# Patient Record
Sex: Female | Born: 1995 | Race: Black or African American | Hispanic: No | Marital: Single | Smoking: Never smoker
Health system: Southern US, Community
[De-identification: ages and names within clinical notes are randomized; demographics above are authoritative.]

## PROBLEM LIST (undated history)

## (undated) ENCOUNTER — Inpatient Hospital Stay: Payer: Self-pay

## (undated) DIAGNOSIS — Z87440 Personal history of urinary (tract) infections: Secondary | ICD-10-CM

## (undated) DIAGNOSIS — D649 Anemia, unspecified: Secondary | ICD-10-CM

## (undated) DIAGNOSIS — Z8751 Personal history of pre-term labor: Secondary | ICD-10-CM

## (undated) HISTORY — DX: Personal history of urinary (tract) infections: Z87.440

## (undated) HISTORY — PX: NO PAST SURGERIES: SHX2092

## (undated) HISTORY — DX: Personal history of pre-term labor: Z87.51

## (undated) HISTORY — DX: Anemia, unspecified: D64.9

## (undated) HISTORY — PX: OTHER SURGICAL HISTORY: SHX169

---
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
## 2015-05-02 NOTE — H&P (Signed)
L&D Evaluation:  History:  HPI Pt is a 19 yo G1P0 pt of [facility] at 36.[redacted] weeks GA with and EDC of 02/05/14 who presents to L&D with reports of leaking fluid and contractions. Her prenatal course has been complicated by teen pregnancy, anemia, and + for chlamydia on 07/28/13 with a negstive TOC on 11/17/13. She is B+, RNI, VNI, GBS unknown. She did recieve her TDaP and Flu vaccine this pregnancy.   Presents with leaking fluid   Patient's Medical History No Chronic Illness   Patient's Surgical History none   Medications Pre Natal Vitamins  Iron   Allergies NKDA   Social History none   Family History Non-Contributory   ROS:  ROS All systems were reviewed.  HEENT, CNS, GI, GU, Respiratory, CV, Renal and Musculoskeletal systems were found to be normal.   Exam:  Vital Signs stable   General no apparent distress   Mental Status clear   Chest clear   Heart normal sinus rhythm   Abdomen gravid, tender with contractions   Pelvic by RN 7-8, ROM upon arrival   Mebranes Ruptured   Description green/meconium   FHT normal rate with no decels   Ucx irregular, every 1-3 minutes   Skin dry, no lesions, no rashes   Lymph no lymphadenopathy   Impression:  Impression active labor, PTL, PPROM, reactive NST, IUP at 36.2 weeks   Plan:  Plan EFM/NST, monitor contractions and for cervical change, antibiotics for unknown GBS status, GC/CT, IV pain meds as desired, anticipate SVD   Follow Up Appointment need to schedule   Electronic Signatures: Jannet MantisSubudhi, Emmeline Winebarger (CNM)  (Signed 19-Jan-15 09:03)  Authored: L&D Evaluation   Last Updated: 19-Jan-15 09:03 by Jannet MantisSubudhi, Travelle Mcclimans (CNM)

## 2017-02-18 LAB — HM PAP SMEAR: HM Pap smear: NEGATIVE

## 2019-08-20 ENCOUNTER — Other Ambulatory Visit: Payer: Self-pay

## 2019-08-20 DIAGNOSIS — Z20822 Contact with and (suspected) exposure to covid-19: Secondary | ICD-10-CM

## 2019-08-21 LAB — NOVEL CORONAVIRUS, NAA: SARS-CoV-2, NAA: NOT DETECTED

## 2019-11-30 ENCOUNTER — Ambulatory Visit (LOCAL_COMMUNITY_HEALTH_CENTER): Payer: Self-pay

## 2019-11-30 ENCOUNTER — Other Ambulatory Visit: Payer: Self-pay

## 2019-11-30 VITALS — BP 124/76 | Ht 62.5 in | Wt 104.5 lb

## 2019-11-30 DIAGNOSIS — Z3201 Encounter for pregnancy test, result positive: Secondary | ICD-10-CM

## 2019-11-30 LAB — PREGNANCY, URINE: Preg Test, Ur: POSITIVE — AB

## 2019-11-30 NOTE — Progress Notes (Signed)
Plans [facility] at [facility] (where employed). Vitamins not dispensed as currently taking vitamins she has purchased. Rich Number, RN

## 2020-04-07 ENCOUNTER — Encounter: Payer: Self-pay | Admitting: Emergency Medicine

## 2020-04-07 ENCOUNTER — Emergency Department
Admission: EM | Admit: 2020-04-07 | Discharge: 2020-04-07 | Disposition: A | Discharge status: Home / Self Care | Payer: BC Managed Care – PPO | Attending: Emergency Medicine | Admitting: Emergency Medicine

## 2020-04-07 ENCOUNTER — Other Ambulatory Visit: Payer: Self-pay

## 2020-04-07 DIAGNOSIS — Y999 Unspecified external cause status: Secondary | ICD-10-CM | POA: Insufficient documentation

## 2020-04-07 DIAGNOSIS — Y93E1 Activity, personal bathing and showering: Secondary | ICD-10-CM | POA: Diagnosis not present

## 2020-04-07 DIAGNOSIS — W182XXA Fall in (into) shower or empty bathtub, initial encounter: Secondary | ICD-10-CM | POA: Diagnosis not present

## 2020-04-07 DIAGNOSIS — S20222A Contusion of left back wall of thorax, initial encounter: Secondary | ICD-10-CM | POA: Insufficient documentation

## 2020-04-07 DIAGNOSIS — M542 Cervicalgia: Secondary | ICD-10-CM | POA: Diagnosis not present

## 2020-04-07 DIAGNOSIS — Y92002 Bathroom of unspecified non-institutional (private) residence single-family (private) house as the place of occurrence of the external cause: Secondary | ICD-10-CM | POA: Diagnosis not present

## 2020-04-07 DIAGNOSIS — O9A212 Injury, poisoning and certain other consequences of external causes complicating pregnancy, second trimester: Secondary | ICD-10-CM | POA: Diagnosis present

## 2020-04-07 DIAGNOSIS — Z79899 Other long term (current) drug therapy: Secondary | ICD-10-CM | POA: Diagnosis not present

## 2020-04-07 DIAGNOSIS — Z3A26 26 weeks gestation of pregnancy: Secondary | ICD-10-CM | POA: Insufficient documentation

## 2020-04-07 LAB — URINALYSIS, COMPLETE (UACMP) WITH MICROSCOPIC
Bilirubin Urine: NEGATIVE
Glucose, UA: NEGATIVE mg/dL
Hgb urine dipstick: NEGATIVE
Ketones, ur: NEGATIVE mg/dL
Nitrite: NEGATIVE
Protein, ur: NEGATIVE mg/dL
Specific Gravity, Urine: 1.014 (ref 1.005–1.030)
pH: 7 (ref 5.0–8.0)

## 2020-04-07 MED ORDER — LIDOCAINE 5 % EX PTCH: 1.0000 | MEDICATED_PATCH | Freq: Two times a day (BID) | CUTANEOUS | 0 refills | Status: AC

## 2020-04-07 MED ORDER — LIDOCAINE 5 % EX PTCH
1.0000 | MEDICATED_PATCH | CUTANEOUS | Status: DC
  Administered 2020-04-07: 13:00:00 1 via TRANSDERMAL
  Filled 2020-04-07: qty 1

## 2020-04-07 NOTE — ED Provider Notes (Signed)
[facility] Emergency Department Provider Note   ____________________________________________   First MD Initiated Contact with Patient 04/07/20 1235     (approximate)  I have reviewed the triage vital signs and the nursing notes.   HISTORY  Chief Complaint Back Pain    HPI Latoya Howell is a 24 y.o. female patient complain left flank pain secondary to a slip and fall in the shower noticed morning.  Patient rates her pain as a 6/10.  Patient described painhas Beltone mold.  In her verbal form daily but one of the joint line s "achy".  Patient also [redacted] weeks gestation and states she has not felt the baby move since the fall.  Patient denies abdominal pain or vaginal bleeding.       Past Medical History:  Diagnosis Date  . Anemia   . History of premature delivery    at 36 wks 2 d  . History of urinary tract infection     There are no problems to display for this patient.   Past Surgical History:  Procedure Laterality Date  . denies surgical hx      Prior to Admission medications   Medication Sig Start Date End Date Taking? Authorizing Provider  lidocaine (LIDODERM) 5 % Place 1 patch onto the skin every 12 (twelve) hours. Remove & Discard patch within 12 hours or as directed by MD 04/07/20 04/07/21  Sable Feil, PA-C  prenatal vitamin w/FE, FA (PRENATAL 1 + 1) 27-1 MG TABS tablet Take 1 tablet by mouth daily at 12 noon.    [provider]    Allergies Patient has no known allergies.  No family history on file.  Social History Social History   Tobacco Use  . Smoking status: Never Smoker  . Smokeless tobacco: Never Used  Substance Use Topics  . Alcohol use: Yes    Comment: Last use Thanksgiving  . Drug use: Yes    Types: Marijuana    Comment: Last use Thanksgiving 2020.    Review of Systems Constitutional: No fever/chills Eyes: No visual changes. ENT: No sore throat. Cardiovascular: Denies chest pain. Respiratory:  Denies shortness of breath. Gastrointestinal: No abdominal pain.  No nausea, no vomiting.  No diarrhea.  No constipation. Genitourinary: Negative for dysuria. Musculoskeletal: Left flank pain.   Skin: Negative for rash. Neurological: Negative for headaches, focal weakness or numbness.   ____________________________________________   PHYSICAL EXAM:  VITAL SIGNS: ED Triage Vitals  Enc Vitals Group     BP 04/07/20 1139 (!) 150/73     Pulse Rate 04/07/20 1139 94     Resp 04/07/20 1139 16     Temp 04/07/20 1139 98.5 F (36.9 C)     Temp Source 04/07/20 1139 Oral     SpO2 04/07/20 1139 100 %     Weight 04/07/20 1138 135 lb (61.2 kg)     Height 04/07/20 1138 5\' 1"  (1.549 m)     Head Circumference --      Peak Flow --      Pain Score 04/07/20 1138 6     Pain Loc --      Pain Edu? --      Excl. in [place]? --    Constitutional: Alert and oriented. Well appearing and in no acute distress. Neck: No cervical spine tenderness to palpation. Hematological/Lymphatic/Immunilogical: No cervical lymphadenopathy. Cardiovascular: Normal rate, regular rhythm. Grossly normal heart sounds.  Good peripheral circulation. Respiratory: Normal respiratory effort.  No retractions. Lungs CTAB. Gastrointestinal: Soft  and nontender. No distention. No abdominal bruits. No CVA tenderness. Genitourinary: Deferred Musculoskeletal: No lower extremity tenderness nor edema.  No joint effusions. Neurologic:  Normal speech and language. No gross focal neurologic deficits are appreciated. No gait instability. Skin:  Skin is warm, dry and intact. No rash noted.  No abrasion or ecchymosis. Psychiatric: Mood and affect are normal. Speech and behavior are normal.  ____________________________________________   LABS (all labs ordered are listed, but only abnormal results are displayed)  Labs Reviewed  URINALYSIS, COMPLETE (UACMP) WITH MICROSCOPIC - Abnormal; Notable for the following components:      Result Value    Color, Urine YELLOW (*)    APPearance CLOUDY (*)    Leukocytes,Ua TRACE (*)    Bacteria, UA RARE (*)    All other components within normal limits   ____________________________________________  EKG   ____________________________________________  RADIOLOGY  ED MD interpretation:    Official radiology report(s): No results found.  ____________________________________________   PROCEDURES  Procedure(s) performed (including Critical Care):  Procedures   ____________________________________________   INITIAL IMPRESSION / ASSESSMENT AND PLAN / ED COURSE  As part of my medical decision making, I reviewed the following data within the electronic MEDICAL RECORD NUMBER     Patient presents with left flank pain secondary to a slip and fall in the shower prior to arrival.  Physical exam was remarkable guarding with palpation the left CVA area.  Discussed negative urinalysis with patient.  Patient had fetal heart tones of 146.  Status post ultrasound placement patient felt fetal movement.  Patient given discharge care instruction and advised why imaging is contraindicated.  Advised extra strength Tylenol navigation Lidoderm patches.  Return to ED if condition worsens.    Latoya Howell was evaluated in Emergency Department on 04/07/2020 for the symptoms described in the history of present illness. She was evaluated in the context of the global COVID-19 pandemic, which necessitated consideration that the patient might be at risk for infection with the SARS-CoV-2 virus that causes COVID-19. Institutional protocols and algorithms that pertain to the evaluation of patients at risk for COVID-19 are in a state of rapid change based on information released by regulatory bodies including the CDC and federal and state organizations. These policies and algorithms were followed during the patient's care in the ED.       ____________________________________________   FINAL CLINICAL IMPRESSION(S) / ED  DIAGNOSES  Final diagnoses:  Contusion of left side of back, initial encounter     ED Discharge Orders         Ordered    lidocaine (LIDODERM) 5 %  Every 12 hours     04/07/20 1402           Note:  This document was prepared using Dragon voice recognition software and may include unintentional dictation errors.    Joni Reining, PA-C 04/07/20 1409    Jene Every, MD 04/07/20 1436

## 2020-04-07 NOTE — ED Notes (Signed)
See triage note  Presents s/p fall this am  States she fell while in the shower  Having lower back and neck soreness/pain  Pt is 26 weeks preg   But denies any vaginal bleeding

## 2020-04-07 NOTE — ED Triage Notes (Signed)
Pt to ED via POV, pt states that she fell about 2 hours PTA. Pt has been having back pain since then. Pt states that she wants to get checked out because she is [redacted] weeks pregnant. Pt denies abd cramping or vaginal bleeding. Pt is in NAD.

## 2020-04-07 NOTE — Discharge Instructions (Signed)
Follow discharge care instruction.  Use extra strength Tylenol and apply Lidoderm patch as needed for pain.

## 2021-05-27 ENCOUNTER — Emergency Department
Admission: EM | Admit: 2021-05-27 | Discharge: 2021-05-27 | Disposition: A | Discharge status: Home / Self Care | Payer: Medicaid Other

## 2021-06-20 ENCOUNTER — Ambulatory Visit
Admission: EM | Admit: 2021-06-20 | Discharge: 2021-06-20 | Disposition: A | Discharge status: Home / Self Care | Payer: Medicaid Other | Attending: Family Medicine | Admitting: Family Medicine

## 2021-06-20 ENCOUNTER — Other Ambulatory Visit: Payer: Self-pay

## 2021-06-20 ENCOUNTER — Encounter: Payer: Self-pay | Admitting: Emergency Medicine

## 2021-06-20 DIAGNOSIS — N3000 Acute cystitis without hematuria: Secondary | ICD-10-CM | POA: Insufficient documentation

## 2021-06-20 LAB — POCT URINALYSIS DIP (DEVICE)
Glucose, UA: 100 mg/dL — AB
Ketones, ur: 40 mg/dL — AB
Nitrite: POSITIVE — AB
Protein, ur: >=300 mg/dL — AB
Specific Gravity, Urine: >=1.03 (ref 1.005–1.030)
Urobilinogen, UA: 1 mg/dL (ref 0.0–1.0)
pH: 6 (ref 5.0–8.0)

## 2021-06-20 MED ORDER — CEPHALEXIN 500 MG PO CAPS: 500.0000 mg | ORAL_CAPSULE | Freq: Two times a day (BID) | ORAL | 0 refills | Status: DC

## 2021-06-20 NOTE — ED Provider Notes (Signed)
[facility]    CSN: [id number] Arrival date & time: 06/20/21  1755      History   Chief Complaint Chief Complaint  Patient presents with   Abdominal Pain   Dysuria    HPI  25 year old female presents with the above complaints.  Patient reports a 5-day history of symptoms.  Has been worsening.  Reports dysuria and urinary frequency.  Reports some lower abdominal pain.  She is currently breast-feeding.  She took some Azo and then subsequently stopped as she was told that this should not be taken with breast-feeding.  No fever.  She states that the Azo did help briefly.   Patient denies flank pain and back pain.  No nausea or vomiting.  Past Medical History:  Diagnosis Date   Anemia    History of premature delivery    at 36 wks 2 d   Past Surgical History:  Procedure Laterality Date   denies surgical hx      OB History     Gravida  2   Para  1   Term      Preterm      AB      Living  1      SAB      IAB      Ectopic      Multiple      Live Births  1            Home Medications    Prior to Admission medications   Medication Sig Start Date End Date Taking? Authorizing Provider  cephALEXin (KEFLEX) 500 MG capsule Take 1 capsule (500 mg total) by mouth 2 (two) times daily. 06/20/21  Yes Chrisie Jankovich G, DO  phenazopyridine (PYRIDIUM) 95 MG tablet Take 95 mg by mouth 3 (three) times daily as needed for pain.   Yes [provider]    Social History Social History   Tobacco Use   Smoking status: Never   Smokeless tobacco: Never  Vaping Use   Vaping Use: Never used  Substance Use Topics   Alcohol use: Yes    Comment: Last use Thanksgiving   Drug use: Yes    Types: Marijuana    Comment: Last use Thanksgiving 2020.     Allergies   Patient has no known allergies.   Review of Systems Review of Systems  Constitutional: Negative.   Gastrointestinal:  Positive for abdominal pain.  Genitourinary:  Positive for dysuria  and frequency.    Physical Exam Triage Vital Signs ED Triage Vitals  Enc Vitals Group     BP 06/20/21 1835 137/73     Pulse Rate 06/20/21 1835 81     Resp 06/20/21 1835 18     Temp 06/20/21 1835 98.6 F (37 C)     Temp Source 06/20/21 1835 Oral     SpO2 06/20/21 1835 98 %     Weight --      Height --      Head Circumference --      Peak Flow --      Pain Score 06/20/21 1831 10     Pain Loc --      Pain Edu? --      Excl. in [place]? --    Updated Vital Signs BP 137/73 (BP Location: Left Arm)   Pulse 81   Temp 98.6 F (37 C) (Oral)   Resp 18   LMP 05/28/2021   SpO2 98%   Breastfeeding Yes   Visual  Acuity Right Eye Distance:   Left Eye Distance:   Bilateral Distance:    Right Eye Near:   Left Eye Near:    Bilateral Near:     Physical Exam Vitals and nursing note reviewed.  Constitutional:      General: She is not in acute distress.    Appearance: She is well-developed. She is not ill-appearing.  HENT:     Head: Normocephalic and atraumatic.  Eyes:     General:        Right eye: No discharge.        Left eye: No discharge.     Conjunctiva/sclera: Conjunctivae normal.  Cardiovascular:     Rate and Rhythm: Normal rate and regular rhythm.     Heart sounds: No murmur heard. Pulmonary:     Effort: Pulmonary effort is normal.     Breath sounds: Normal breath sounds. No wheezing or rales.  Abdominal:     General: There is no distension.     Palpations: Abdomen is soft.     Comments: Suprapubic tenderness to palpation.  Neurological:     Mental Status: She is alert.     UC Treatments / Results  Labs (all labs ordered are listed, but only abnormal results are displayed) Labs Reviewed  POCT URINALYSIS DIP (DEVICE) - Abnormal; Notable for the following components:      Result Value   Glucose, UA 100 (*)    Bilirubin Urine SMALL (*)    Ketones, ur 40 (*)    Hgb urine dipstick LARGE (*)    Protein, ur >=300 (*)    Nitrite POSITIVE (*)    Leukocytes,Ua LARGE  (*)    All other components within normal limits  URINE CULTURE  POCT URINALYSIS DIPSTICK, ED / UC  POC URINE PREG, ED    EKG   Radiology No results found.  Procedures Procedures (including critical care time)  Medications Ordered in UC Medications - No data to display  Initial Impression / Assessment and Plan / UC Course  I have reviewed the triage vital signs and the nursing notes.  Pertinent labs & imaging results that were available during my care of the patient were reviewed by me and considered in my medical decision making (see chart for details).    25 year old female presents with UTI.  Sending culture.  Placing on Keflex.  Final Clinical Impressions(s) / UC Diagnoses   Final diagnoses:  Acute cystitis without hematuria   Discharge Instructions   None    ED Prescriptions     Medication Sig Dispense Auth. Provider   cephALEXin (KEFLEX) 500 MG capsule Take 1 capsule (500 mg total) by mouth 2 (two) times daily. 14 capsule Everlene Other G, DO      PDMP not reviewed this encounter.   Tommie Sams, [place] 06/20/21 1949

## 2021-06-20 NOTE — ED Triage Notes (Signed)
Pt presents today with c/o of lower abdominal pain with dysuria x 5 days. Denies vaginal discharge.

## 2021-06-23 LAB — URINE CULTURE: Culture: >=100000 — AB

## 2021-06-26 LAB — POCT PREGNANCY, URINE: Preg Test, Ur: NEGATIVE

## 2021-10-01 ENCOUNTER — Other Ambulatory Visit: Payer: Self-pay

## 2021-10-01 ENCOUNTER — Emergency Department: Payer: Medicaid Other

## 2021-10-01 ENCOUNTER — Emergency Department
Admission: EM | Admit: 2021-10-01 | Discharge: 2021-10-01 | Disposition: A | Discharge status: Home / Self Care | Payer: Medicaid Other | Attending: Emergency Medicine | Admitting: Emergency Medicine

## 2021-10-01 DIAGNOSIS — S161XXA Strain of muscle, fascia and tendon at neck level, initial encounter: Secondary | ICD-10-CM | POA: Diagnosis not present

## 2021-10-01 DIAGNOSIS — S199XXA Unspecified injury of neck, initial encounter: Secondary | ICD-10-CM | POA: Diagnosis present

## 2021-10-01 DIAGNOSIS — Y92410 Unspecified street and highway as the place of occurrence of the external cause: Secondary | ICD-10-CM | POA: Insufficient documentation

## 2021-10-01 LAB — PREGNANCY, URINE: Preg Test, Ur: NEGATIVE

## 2021-10-01 MED ORDER — IBUPROFEN 600 MG PO TABS
600.0000 mg | ORAL_TABLET | Freq: Once | ORAL | Status: AC
  Administered 2021-10-01: 600 mg via ORAL
  Filled 2021-10-01: qty 1

## 2021-10-01 MED ORDER — CYCLOBENZAPRINE HCL 10 MG PO TABS: 10.0000 mg | ORAL_TABLET | Freq: Three times a day (TID) | ORAL | 0 refills | Status: AC | PRN

## 2021-10-01 MED ORDER — CYCLOBENZAPRINE HCL 10 MG PO TABS
5.0000 mg | ORAL_TABLET | Freq: Once | ORAL | Status: AC
  Administered 2021-10-01: 5 mg via ORAL
  Filled 2021-10-01: qty 1
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
## 2021-10-01 NOTE — Discharge Instructions (Signed)
Please use an ice pack on the neck for the next 24 hours.  After that you may use heating pads.  Please use ibuprofen up to 600 mg every 8 hours and naproxen up to 500 mg every 12 hours as needed for continued pain.
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
## 2022-04-23 ENCOUNTER — Ambulatory Visit (LOCAL_COMMUNITY_HEALTH_CENTER): Payer: Medicaid Other | Admitting: Family Medicine

## 2022-04-23 ENCOUNTER — Encounter: Payer: Self-pay | Admitting: Family Medicine

## 2022-04-23 VITALS — BP 123/77 | Ht 62.0 in | Wt 109.2 lb

## 2022-04-23 DIAGNOSIS — Z30013 Encounter for initial prescription of injectable contraceptive: Secondary | ICD-10-CM | POA: Diagnosis not present

## 2022-04-23 DIAGNOSIS — Z3009 Encounter for other general counseling and advice on contraception: Secondary | ICD-10-CM

## 2022-04-23 DIAGNOSIS — Z0000 Encounter for general adult medical examination without abnormal findings: Secondary | ICD-10-CM | POA: Diagnosis not present

## 2022-04-23 DIAGNOSIS — Z3202 Encounter for pregnancy test, result negative: Secondary | ICD-10-CM | POA: Diagnosis not present

## 2022-04-23 DIAGNOSIS — Z309 Encounter for contraceptive management, unspecified: Secondary | ICD-10-CM

## 2022-04-23 DIAGNOSIS — Z3200 Encounter for pregnancy test, result unknown: Secondary | ICD-10-CM

## 2022-04-23 LAB — PREGNANCY, URINE: Preg Test, Ur: NEGATIVE

## 2022-04-23 MED ORDER — MEDROXYPROGESTERONE ACETATE 150 MG/ML IM SUSP
150.0000 mg | INTRAMUSCULAR | Status: AC
Start: 2022-04-23 — End: 2023-04-18
  Administered 2022-04-23: 150 mg via INTRAMUSCULAR

## 2022-04-23 NOTE — Progress Notes (Addendum)
Pt here for PE and birth control.  Pt notified of negative PT.  Depo 150 mg given IM in RUOQ without any complications.  Pt given reminder card to return in 11-13 weeks for next Depo injection.  Berdie Ogren, RN ? ?

## 2022-04-23 NOTE — Progress Notes (Signed)
[facility] DEPARTMENT ?[facility] ?[address] [employer] ?Main Number: [phone] ? ? ? ?Family Planning Visit- Initial Visit ? ?Subjective:  ?Latoya Howell is a 26 y.o.  [id number]   being seen today for an initial annual visit and to discuss reproductive life planning.  The patient is currently using No Method - No Contraceptive Precautions for pregnancy prevention. Patient reports   does not want a pregnancy in the next year.   ? ? report they are looking for a method that provides High efficacy at preventing pregnancy, Minimal bleeding/improved bleeding profile, Discrete method, Long term method, and Methods that does not involve too much memory ? ?Patient has the following medical conditions does not have a problem list on file. ? ?Chief Complaint  ?Patient presents with  ? Annual Exam  ?  Contraception  ? ? ?Patient reports here for physical and Depo, eventually desires nexplanon  ? ?Patient denies any other concerns.  ? ?Body mass index is 19.97 kg/m?. - Patient is eligible for diabetes screening based on BMI and age >40?  no ?HA1C ordered? not applicable ? ?Patient reports 1  partner/s in last year. Desires STI screening?  No - declined  ? ?Has patient been screened once for HCV in the past?  Yes ? No results found for: HCVAB ? ?Does the patient have current drug use (including MJ), have a partner with drug use, and/or has been incarcerated since last result? No  ?If yes-- Screen for HCV through [facility] ?  ?Does the patient meet criteria for HBV testing? Yes ? ?Criteria:  ?-Household, sexual or needle sharing contact with HBV ?-History of drug use ?-HIV positive ?-Those with known Hep C ? ? ?Health Maintenance Due  ?Topic Date Due  ? COVID-19 Vaccine (1) Never done  ? HPV VACCINES (1 - 2-dose series) Never done  ? Hepatitis C Screening  Never done  ? PAP SMEAR-Modifier  02/19/2020  ? ? ?Review of Systems  ?Constitutional:  Negative for chills, fever, malaise/fatigue and  weight loss.  ?HENT:  Negative for congestion, hearing loss and sore throat.   ?Eyes:  Negative for blurred vision, double vision and photophobia.  ?Respiratory:  Negative for shortness of breath.   ?Cardiovascular:  Negative for chest pain.  ?Gastrointestinal:  Negative for abdominal pain, blood in stool, constipation, diarrhea, heartburn, nausea and vomiting.  ?Genitourinary:  Negative for dysuria and frequency.  ?Musculoskeletal:  Negative for back pain, joint pain and neck pain.  ?Skin:  Negative for itching and rash.  ?Neurological:  Negative for dizziness, weakness and headaches.  ?Endo/Heme/Allergies:  Does not bruise/bleed easily.  ?Psychiatric/Behavioral:  Negative for depression, substance abuse and suicidal ideas.   ? ?The following portions of the patient's history were reviewed and updated as appropriate: allergies, current medications, past family history, past medical history, past social history, past surgical history and problem list. Problem list updated. ? ? ?See flowsheet for other program required questions. ? ?Objective:  ? ?Vitals:  ? 04/23/22 1347  ?BP: 123/77  ?Weight: 109 lb 3.2 oz (49.5 kg)  ?Height: 5\' 2"  (1.575 m)  ? ? ?Physical Exam ?Vitals and nursing note reviewed.  ?Constitutional:   ?   Appearance: Normal appearance.  ?HENT:  ?   Head: Normocephalic and atraumatic.  ?   Mouth/Throat:  ?   Mouth: Mucous membranes are moist.  ?   Dentition: Normal dentition. No dental caries.  ?   Pharynx: No oropharyngeal exudate or posterior oropharyngeal erythema.  ?  Eyes:  ?   General: No scleral icterus. ?Neck:  ?   Thyroid: No thyroid mass, thyromegaly or thyroid tenderness.  ?Cardiovascular:  ?   Rate and Rhythm: Normal rate and regular rhythm.  ?   Pulses: Normal pulses.  ?   Heart sounds: Normal heart sounds.  ?Pulmonary:  ?   Effort: Pulmonary effort is normal.  ?   Breath sounds: Normal breath sounds.  ?Chest:  ?Breasts: ?   Tanner Score is 5.  ?   Breasts are symmetrical.  ?   Right:  Normal.  ?   Left: Normal.  ?   Comments: Breasts:  ?      Right: Normal. No swelling, mass, nipple discharge, skin change or tenderness.  ?      Left: Normal. No swelling, mass, nipple discharge, skin change or tenderness.   ?Abdominal:  ?   General: Abdomen is flat. Bowel sounds are normal.  ?   Palpations: Abdomen is soft.  ?Musculoskeletal:     ?   General: Normal range of motion.  ?   Cervical back: Normal range of motion and neck supple.  ?Skin: ?   General: Skin is warm and dry.  ?Neurological:  ?   General: No focal deficit present.  ?   Mental Status: She is alert.  ? ? ? ? ?Assessment and Plan:  ?Latoya Howell is a 26 y.o. female presenting to the [facility] Department for an initial annual wellness/contraceptive visit ? ?Contraception counseling: Reviewed options based on patient desire and reproductive life plan. Patient is interested in Hormonal Implant and Hormonal Injection. This (injection) was provided to the patient today. Pt wants to discuss with job about changes before getting nexplanon d/t possible damage to device with current work.  ? ?Risks, benefits, and typical effectiveness rates were reviewed.  Questions were answered.  Written information was also given to the patient to review.   ? ?The patient will follow up in  1 years for surveillance.  The patient was told to call with any further questions, or with any concerns about this method of contraception.  Emphasized use of condoms 100% of the time for STI prevention. ? ?Need for ECP was assessed. Patient reported > 120 hours .  Reviewed options and patient desired No method of ECP, declined all   ? ?1. Encounter for pregnancy test, result unknown ?- Pregnancy, urine ? ?2. Routine general medical examination at a health care facility ?Well woman exam today ?Pap today  ?CBE today- self breast exam taught and teach back method used.   ? ?3. Encounter for initial prescription of injectable contraceptive ?Pt desires Depo and wants  to eventually change to nexplanon.  ?- medroxyPROGESTERone (DEPO-PROVERA) injection 150 mg ? ? ? ? ?No follow-ups on file. ? ?No future appointments. ? ?Wendi Snipes, FNP ?

## 2022-05-14 ENCOUNTER — Encounter: Payer: Self-pay | Admitting: Emergency Medicine

## 2022-05-14 ENCOUNTER — Other Ambulatory Visit: Payer: Self-pay

## 2022-05-14 ENCOUNTER — Ambulatory Visit
Admission: EM | Admit: 2022-05-14 | Discharge: 2022-05-14 | Disposition: A | Discharge status: Home / Self Care | Payer: Medicaid Other | Attending: Emergency Medicine | Admitting: Emergency Medicine

## 2022-05-14 DIAGNOSIS — R350 Frequency of micturition: Secondary | ICD-10-CM | POA: Insufficient documentation

## 2022-05-14 LAB — WET PREP, GENITAL
Clue Cells Wet Prep HPF POC: NONE SEEN
Sperm: NONE SEEN
Trich, Wet Prep: NONE SEEN
WBC, Wet Prep HPF POC: <10 — AB (ref <10)
Yeast Wet Prep HPF POC: NONE SEEN

## 2022-05-14 LAB — URINALYSIS, ROUTINE W REFLEX MICROSCOPIC
Bilirubin Urine: NEGATIVE
Glucose, UA: NEGATIVE mg/dL
Ketones, ur: NEGATIVE mg/dL
Nitrite: NEGATIVE
Protein, ur: >300 mg/dL — AB
Specific Gravity, Urine: 1.025 (ref 1.005–1.030)
pH: 7 (ref 5.0–8.0)

## 2022-05-14 LAB — URINALYSIS, MICROSCOPIC (REFLEX)

## 2022-05-14 MED ORDER — NITROFURANTOIN MONOHYD MACRO 100 MG PO CAPS: 100.0000 mg | ORAL_CAPSULE | Freq: Two times a day (BID) | ORAL | 0 refills | Status: DC

## 2022-05-14 NOTE — ED Notes (Signed)
Patient in bathroom

## 2022-05-14 NOTE — ED Triage Notes (Addendum)
4/20 started depo birth control.  Patient didn't start a period until 5/15  Low abdominal pain started 5/15.  Patient reports pain and increase in frequency of urination.    Patient reports taking 4 Cystex pills today and continues to have pain

## 2022-05-14 NOTE — Discharge Instructions (Signed)
Your urinalysis shows Latoya Howell blood cells but not nitrates which is an enzyme released from that bacteria therefore your urine will be sent to the lab to determine exactly which bacteria is present, if any changes need to be made to your medications you will be notified  Wet prep negative for yeast, BV and trichomonas uses  Since you are currently having symptoms that appear to be worsening we will start you some antibiotic, take Macrobid twice daily for 5 days   You may use over-the-counter Azo to help minimize your symptoms until antibiotic removes bacteria, this medication will turn your urine orange  Increase your fluid intake through use of water  As always practice good hygiene, wiping front to back and avoidance of scented vaginal products to prevent further irritation  If symptoms continue to persist after use of medication or recur please follow-up with urgent care or your primary doctor as needed

## 2022-05-14 NOTE — ED Provider Notes (Signed)
[facility]    CSN: [id number] Arrival date & time: 05/14/22  1019      History   Chief Complaint Chief Complaint  Patient presents with   Urinary Tract Infection    HPI Annalyse Langlais is a 26 y.o. female.   Patient presents with urinary frequency, increased urgency, dysuria and lower abdominal cramping for 7 days.  Symptoms have worsened over the last week.  Started Depo-Provera on 420 which is because an irregular menses, currently has been bleeding for 2 weeks, feels that the bleeding has thrown off her pH.  Has attempted use of Cystex which has been somewhat helpful.  Denies hematuria, flank pain, fever, chills, new rash or lesions, vaginal discharge, itching or odor.    Past Medical History:  Diagnosis Date   Anemia    History of premature delivery    at 36 wks 2 d    There are no problems to display for this patient.   Past Surgical History:  Procedure Laterality Date   denies surgical hx      OB History     Gravida  3   Para  2   Term      Preterm      AB  1   Living  2      SAB      IAB      Ectopic      Multiple      Live Births  2            Home Medications    Prior to Admission medications   Medication Sig Start Date End Date Taking? Authorizing Provider  Methenamine-Sodium Salicylate (CYSTEX PO) Take by mouth. Has been taking since friday   Yes [provider]  phenazopyridine (PYRIDIUM) 95 MG tablet Take 95 mg by mouth 3 (three) times daily as needed for pain. Patient not taking: Reported on 04/23/2022    [provider]    Family History Family History  Problem Relation Age of Onset   Diabetes Maternal Grandmother    Diabetes Maternal Grandfather    Diabetes Maternal Aunt     Social History Social History   Tobacco Use   Smoking status: Never   Smokeless tobacco: Never  Vaping Use   Vaping Use: Never used  Substance Use Topics   Alcohol use: Not Currently    Alcohol/week: 1.0  standard drink    Types: 1 Glasses of wine per week    Comment: Birthday in January 2023   Drug use: Not Currently    Types: Marijuana    Comment: Last use Thanksgiving 2020.     Allergies   Patient has no known allergies.   Review of Systems Review of Systems  Constitutional: Negative.   HENT: Negative.    Respiratory: Negative.    Genitourinary:  Positive for dysuria, frequency and pelvic pain. Negative for decreased urine volume, difficulty urinating, dyspareunia, enuresis, flank pain, genital sores, hematuria, menstrual problem, urgency, vaginal bleeding, vaginal discharge and vaginal pain.  Skin: Negative.     Physical Exam Triage Vital Signs ED Triage Vitals  Enc Vitals Group     BP 05/14/22 1032 139/83     Pulse Rate 05/14/22 1032 79     Resp 05/14/22 1032 18     Temp 05/14/22 1032 98.1 F (36.7 C)     Temp Source 05/14/22 1032 Oral     SpO2 05/14/22 1032 100 %     Weight --  Height --      Head Circumference --      Peak Flow --      Pain Score 05/14/22 1027 9     Pain Loc --      Pain Edu? --      Excl. in GC? --    No data found.  Updated Vital Signs BP 139/83 (BP Location: Left Arm)   Pulse 79   Temp 98.1 F (36.7 C) (Oral)   Resp 18   LMP 05/06/2022   SpO2 100%   Visual Acuity Right Eye Distance:   Left Eye Distance:   Bilateral Distance:    Right Eye Near:   Left Eye Near:    Bilateral Near:     Physical Exam Constitutional:      Appearance: Normal appearance.  HENT:     Head: Normocephalic.  Eyes:     Extraocular Movements: Extraocular movements intact.  Pulmonary:     Effort: Pulmonary effort is normal.  Abdominal:     General: Abdomen is flat. Bowel sounds are normal. There is no distension.     Palpations: Abdomen is soft.     Tenderness: There is no abdominal tenderness. There is no right CVA tenderness or left CVA tenderness.  Skin:    General: Skin is warm and dry.  Neurological:     Mental Status: She is alert and  oriented to person, place, and time. Mental status is at baseline.  Psychiatric:        Mood and Affect: Mood normal.        Behavior: Behavior normal.     UC Treatments / Results  Labs (all labs ordered are listed, but only abnormal results are displayed) Labs Reviewed  WET PREP, GENITAL  URINALYSIS, ROUTINE W REFLEX MICROSCOPIC    EKG   Radiology No results found.  Procedures Procedures (including critical care time)  Medications Ordered in UC Medications - No data to display  Initial Impression / Assessment and Plan / UC Course  I have reviewed the triage vital signs and the nursing notes.  Pertinent labs & imaging results that were available during my care of the patient were reviewed by me and considered in my medical decision making (see chart for details).  Urinary frequency  Wet prep negative, urinalysis showing [facility] blood cells, negative for nitrates, rare bacteria seen under the microscope, sent for culture, no tenderness is noted on the abdominal exam, no CVA tenderness noted but patient is visibly uncomfortable in exam when trying to sit still in efforts to not elicit pain, will begin antibiotic treatment prophylactically, Macrobid 5-day course prescribed, may continue use of Cystex as needed, recommend increase fluid intake and good hygiene, recommended follow-up with urgent care as needed if symptoms continue to persist or worsen, work note given Final Clinical Impressions(s) / UC Diagnoses   Final diagnoses:  None   Discharge Instructions   None    ED Prescriptions   None    PDMP not reviewed this encounter.   Valinda Hoar, NP 05/14/22 1133

## 2022-05-15 LAB — URINE CULTURE: Culture: NO GROWTH

## 2022-07-12 ENCOUNTER — Ambulatory Visit: Payer: Medicaid Other

## 2022-11-13 ENCOUNTER — Ambulatory Visit
Admission: EM | Admit: 2022-11-13 | Discharge: 2022-11-13 | Disposition: A | Discharge status: Home / Self Care | Payer: Medicaid Other | Attending: Emergency Medicine | Admitting: Emergency Medicine

## 2022-11-13 DIAGNOSIS — N946 Dysmenorrhea, unspecified: Secondary | ICD-10-CM | POA: Diagnosis not present

## 2022-11-13 DIAGNOSIS — A059 Bacterial foodborne intoxication, unspecified: Secondary | ICD-10-CM | POA: Insufficient documentation

## 2022-11-13 LAB — URINALYSIS, ROUTINE W REFLEX MICROSCOPIC
Bilirubin Urine: NEGATIVE
Glucose, UA: NEGATIVE mg/dL
Hgb urine dipstick: NEGATIVE
Ketones, ur: NEGATIVE mg/dL
Leukocytes,Ua: NEGATIVE
Nitrite: NEGATIVE
Protein, ur: NEGATIVE mg/dL
Specific Gravity, Urine: 1.01 (ref 1.005–1.030)
pH: 7 (ref 5.0–8.0)

## 2022-11-13 LAB — PREGNANCY, URINE: Preg Test, Ur: NEGATIVE

## 2022-11-13 MED ORDER — NAPROXEN 500 MG PO TABS: 500.0000 mg | ORAL_TABLET | Freq: Two times a day (BID) | ORAL | 0 refills | Status: AC

## 2022-11-13 MED ORDER — ONDANSETRON 4 MG PO TBDP: 4.0000 mg | ORAL_TABLET | Freq: Three times a day (TID) | ORAL | 0 refills | Status: DC | PRN

## 2022-11-13 NOTE — Discharge Instructions (Signed)
Take the Zofran as needed for nausea, vomiting and diarrhea.  Push electrolyte containing fluids such as Pedialyte, Gatorade.  Take the Naprosyn twice a day with 1000 mg of Tylenol.  This will help with the pain and slow down the bleeding.

## 2022-11-13 NOTE — ED Provider Notes (Signed)
HPI  SUBJECTIVE:  Latoya Howell is a 26 y.o. female who presents with 2 days of multiple episodes of nonbilious, nonbloody emesis, watery, nonbloody diarrhea after eating some questionable [place] tacos 2 nights ago.  She states that the symptoms are slowing down, and she is tolerating p.o. today.  She is requesting a doctor's note.  She also reports intermittent, nonmigratory, nonradiating low midline abdominal cramping pain starting yesterday consistent with menstrual cramps.  They last for 5 to 6 minutes, are coming at an irregular pattern, and she reports normal menstrual flow.  No vaginal odor, abnormal discharge.  She is in a long-term monogamous relationship with a female, who is asymptomatic.  STDs are not a concern today.  She has no urinary complaints.  She tried Aleve, soup, and increasing her water intake.  Aleve and soup help.  Symptoms are worse when she eats cold foods, sweets, or when she picks up heavy objects.  She has no past medical history.  LMP: Now.  Denies the possibility of being pregnant.  [facility]    Past Medical History:  Diagnosis Date   Anemia    History of premature delivery    at 36 wks 2 d    Past Surgical History:  Procedure Laterality Date   denies surgical hx      Family History  Problem Relation Age of Onset   Diabetes Maternal Grandmother    Diabetes Maternal Grandfather    Diabetes Maternal Aunt     Social History   Tobacco Use   Smoking status: Never   Smokeless tobacco: Never  Vaping Use   Vaping Use: Never used  Substance Use Topics   Alcohol use: Not Currently    Alcohol/week: 1.0 standard drink of alcohol    Types: 1 Glasses of wine per week    Comment: Birthday in January 2023   Drug use: Not Currently    Types: Marijuana    Comment: Last use Thanksgiving 2020.     Current Facility-Administered Medications:    medroxyPROGESTERone (DEPO-PROVERA) injection 150 mg, 150 mg, Intramuscular, Q90 days, Wendi Snipes, FNP, 150  mg at 04/23/22 1500  Current Outpatient Medications:    naproxen (NAPROSYN) 500 MG tablet, Take 1 tablet (500 mg total) by mouth 2 (two) times daily for 5 days., Disp: 10 tablet, Rfl: 0   ondansetron (ZOFRAN-ODT) 4 MG disintegrating tablet, Take 1 tablet (4 mg total) by mouth every 8 (eight) hours as needed for nausea or vomiting., Disp: 20 tablet, Rfl: 0   Methenamine-Sodium Salicylate (CYSTEX PO), Take by mouth. Has been taking since friday, Disp: , Rfl:   No Known Allergies   ROS  As noted in HPI.   Physical Exam  BP 125/78 (BP Location: Right Arm)   Pulse 78   Temp 98.2 F (36.8 C) (Oral)   Resp 16   SpO2 100%   Constitutional: Well developed, well nourished, no acute distress Eyes:  EOMI, conjunctiva normal bilaterally HENT: Normocephalic, atraumatic,mucus membranes moist Respiratory: Normal inspiratory effort, lungs clear bilaterally. Cardiovascular: Normal rate, regular rhythm, no murmurs, rubs, gallops.  Cap refill less than 2 seconds. GI: Flat, nondistended soft, mild suprapubic tenderness no guarding, rebound.  Active bowel sounds.  Moving around the table comfortably. Back: No CVAT skin: No rash, skin intact Musculoskeletal: no deformities Neurologic: Alert & oriented x 3, no focal neuro deficits Psychiatric: Speech and behavior appropriate   ED Course   Medications - No data to display  Orders Placed This Encounter  Procedures  Urinalysis, Routine w reflex microscopic    Standing Status:   Standing    Number of Occurrences:   1   Pregnancy, urine    Standing Status:   Standing    Number of Occurrences:   1    Results for orders placed or performed during the hospital encounter of 11/13/22 (from the past 24 hour(s))  Urinalysis, Routine w reflex microscopic     Status: None   Collection Time: 11/13/22 10:47 AM  Result Value Ref Range   Color, Urine YELLOW YELLOW   APPearance CLEAR CLEAR   Specific Gravity, Urine 1.010 1.005 - 1.030   pH 7.0 5.0 -  8.0   Glucose, UA NEGATIVE NEGATIVE mg/dL   Hgb urine dipstick NEGATIVE NEGATIVE   Bilirubin Urine NEGATIVE NEGATIVE   Ketones, ur NEGATIVE NEGATIVE mg/dL   Protein, ur NEGATIVE NEGATIVE mg/dL   Nitrite NEGATIVE NEGATIVE   Leukocytes,Ua NEGATIVE NEGATIVE  Pregnancy, urine     Status: None   Collection Time: 11/13/22 10:48 AM  Result Value Ref Range   Preg Test, Ur NEGATIVE NEGATIVE   No results found.  ED Clinical Impression  1. Food poisoning   2. Dysmenorrhea      ED Assessment/Plan      UA, urine pregnancy negative.  1.  Nausea/vomiting/diarrhea-presentation consistent with gastroenteritis/food poisoning.  Patient is tolerating p.o. today.  She appears well-hydrated.  Will send home with Zofran.  Patient to continue pushing fluids.  2.  Lower abdominal pain-patient states this is consistent with menses.  She is reporting normal flow.  No signs or symptoms of anemia.  home with Naprosyn/Tylenol, work note to return on Saturday.  Discussed labs,MDM, treatment plan, and plan for follow-up with patient. . patient agrees with plan.   Meds ordered this encounter  Medications   naproxen (NAPROSYN) 500 MG tablet    Sig: Take 1 tablet (500 mg total) by mouth 2 (two) times daily for 5 days.    Dispense:  10 tablet    Refill:  0   ondansetron (ZOFRAN-ODT) 4 MG disintegrating tablet    Sig: Take 1 tablet (4 mg total) by mouth every 8 (eight) hours as needed for nausea or vomiting.    Dispense:  20 tablet    Refill:  0      *This clinic note was created using Scientist, clinical (histocompatibility and immunogenetics). Therefore, there may be occasional mistakes despite careful proofreading.  ?    Domenick Gong, MD 11/16/22 1623

## 2022-11-13 NOTE — ED Triage Notes (Signed)
Pt present abdomen pain with diarrhea and cramping. Symptom started yesterday.  Pt states her period started yesterday and very heavy.

## 2022-12-05 IMAGING — CT CT CERVICAL SPINE W/O CM
3 of 4 series · 13 of 33 positions shown, 16 images · non-contrast
Comparison: None.

CLINICAL DATA: Neck trauma, dangerous injury mechanism (Age 16-64y)

EXAM:
CT CERVICAL SPINE WITHOUT CONTRAST
TECHNIQUE: Multidetector CT imaging of the cervical spine was performed without
intravenous contrast. Multiplanar CT image reconstructions were also
generated.

[Series 4: sagittal bone · sagittal · 0.28mm/px · 5 of 87 slices shown, 6 images]
[im 29/87  bone]
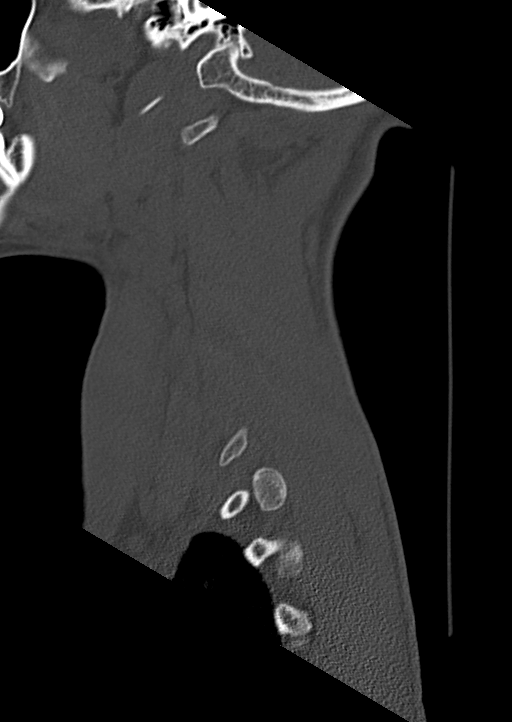
[im 36/87  bone]
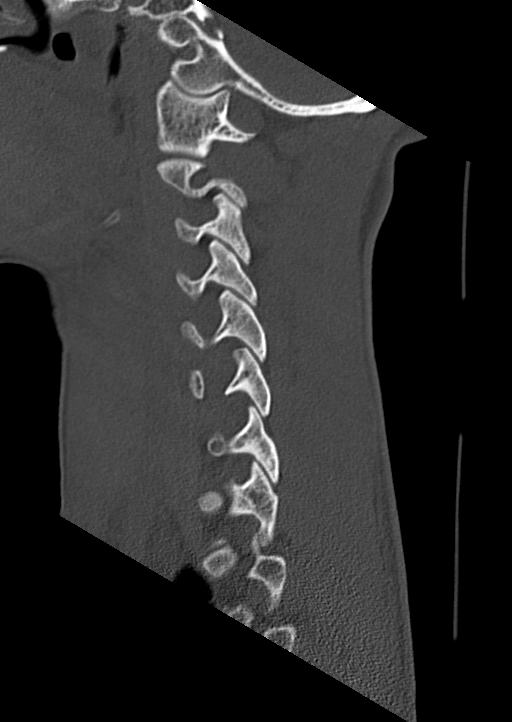
[im 44/87  soft-tissue]
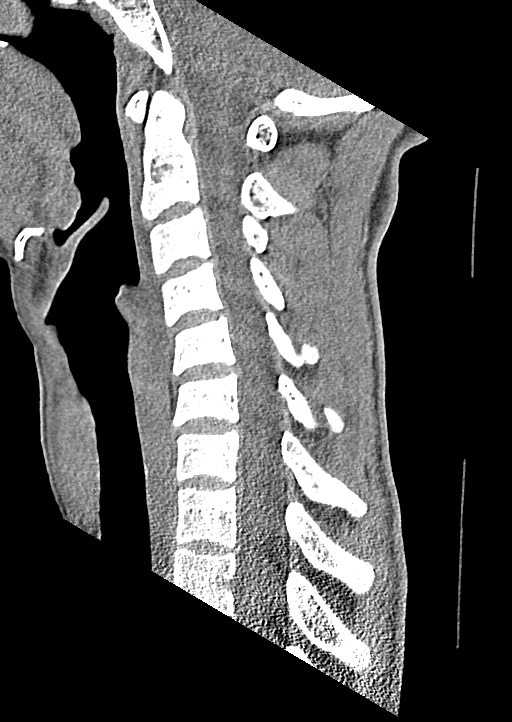
[im 44/87  bone]
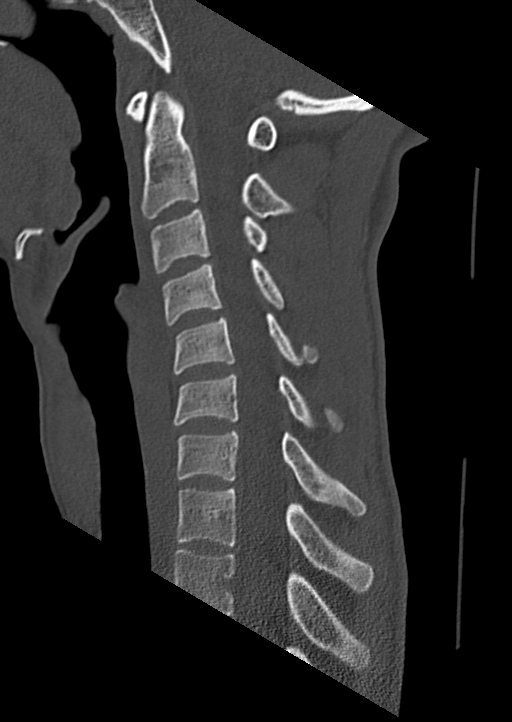
[im 51/87  bone]
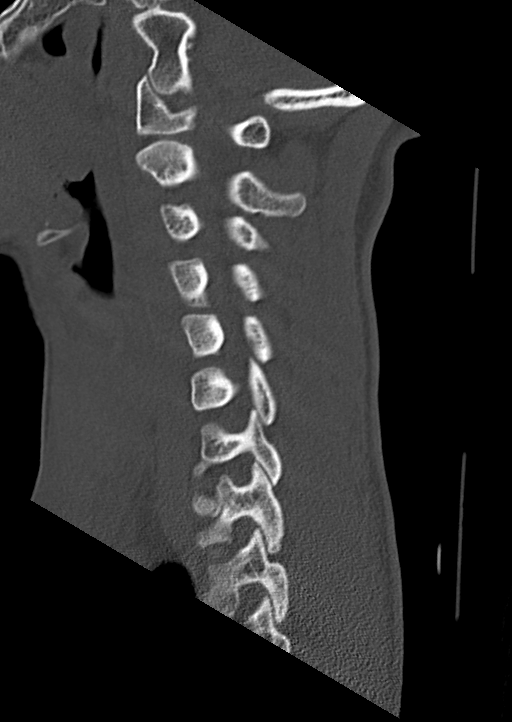
[im 58/87  bone]
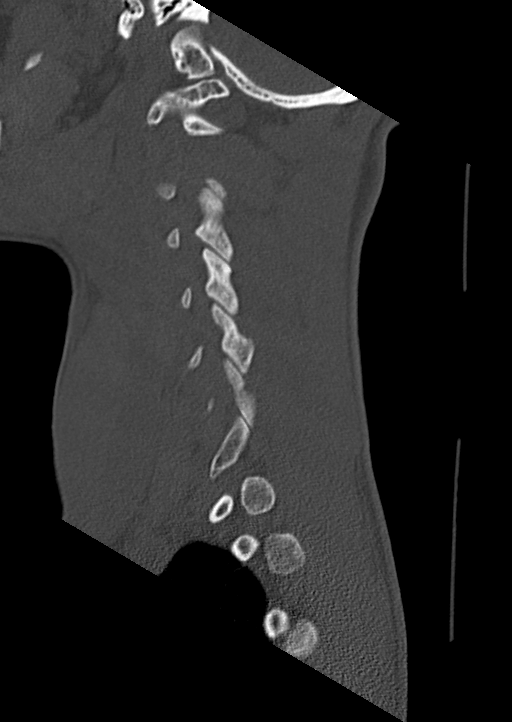

[Series 5: coronal bone · coronal · 0.34mm/px · 3 of 73 slices shown]
[im 22/73  bone]
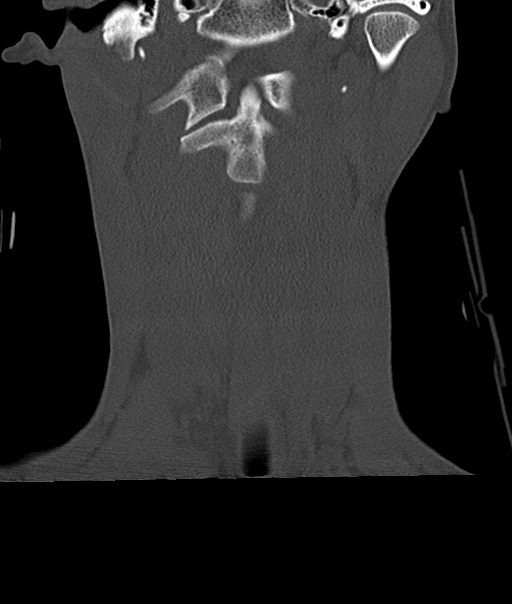
[im 32/73  bone]
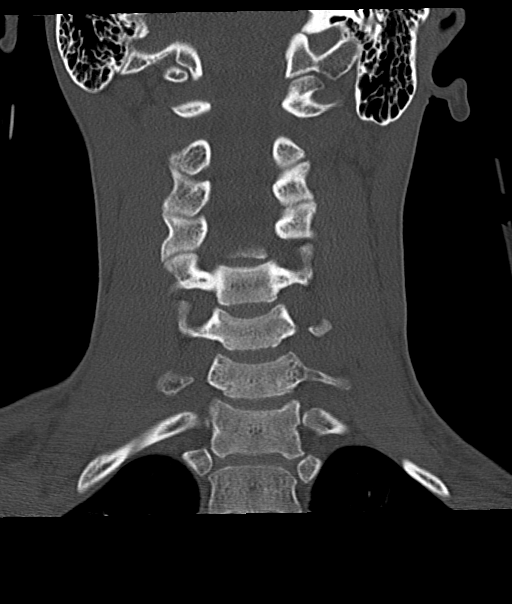
[im 41/73  bone]
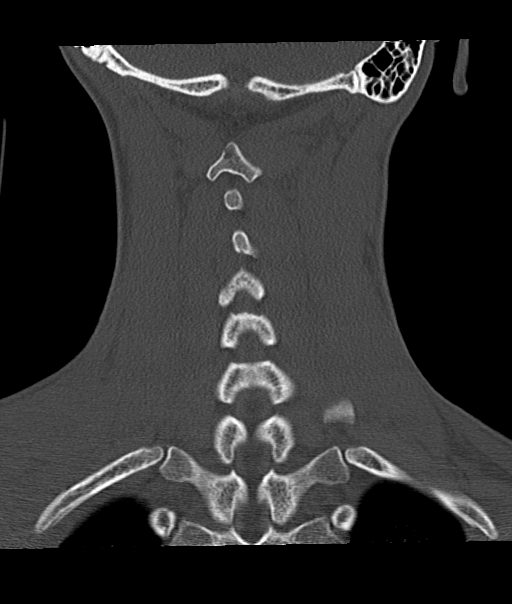

[Series 6: orthogonal bone · axial · 0.28mm/px · z∈[+107,+230]mm · 5 of 102 slices shown, 7 images]
[im 15/102  soft-tissue]
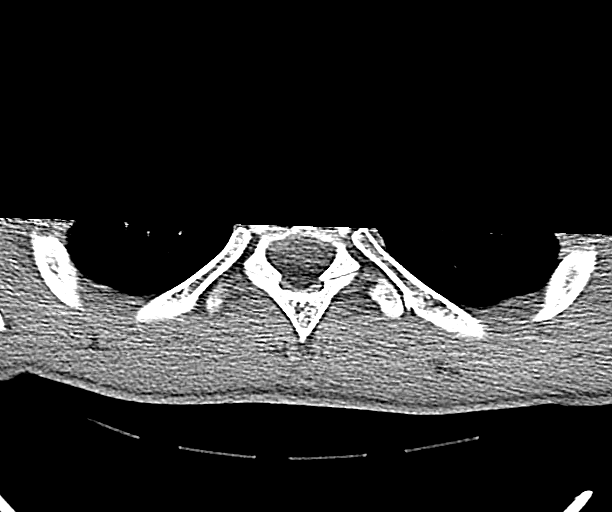
[im 15/102  bone]
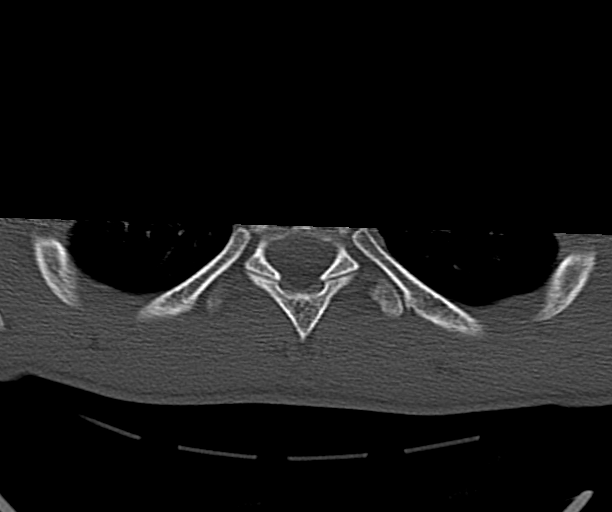
[im 29/102  bone]
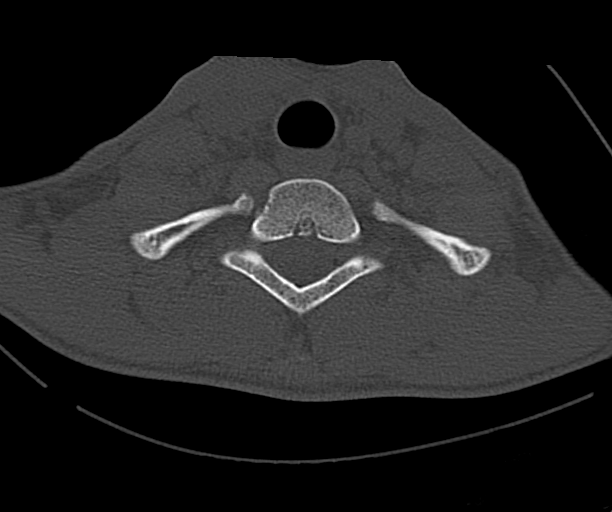
[im 58/102  bone]
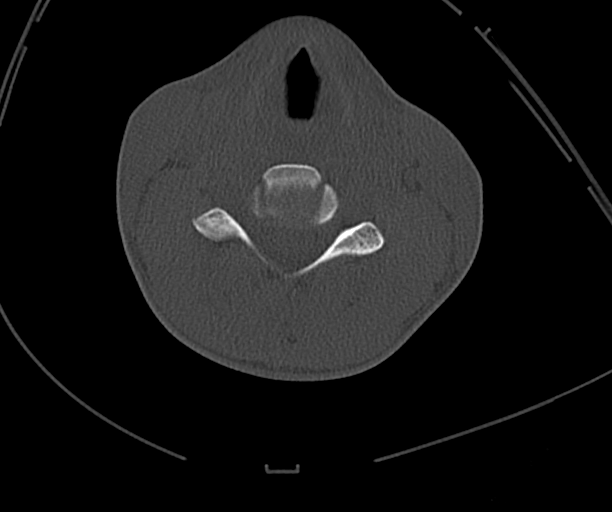
[im 73/102  bone]
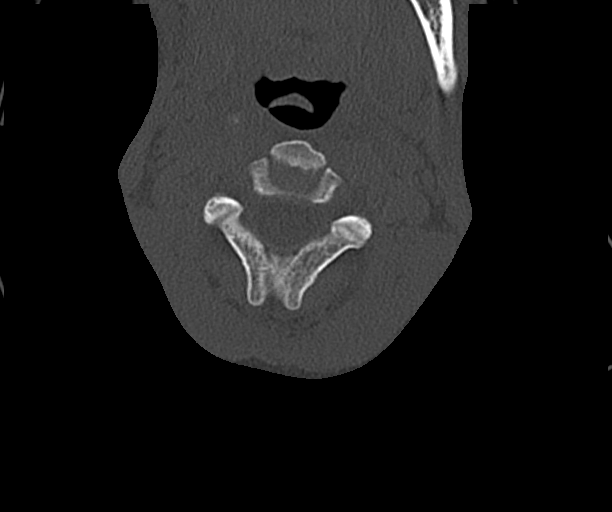
[im 87/102  soft-tissue]
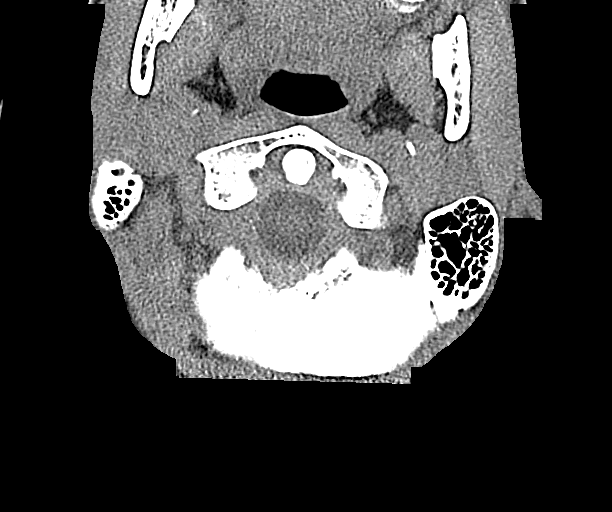
[im 87/102  bone]
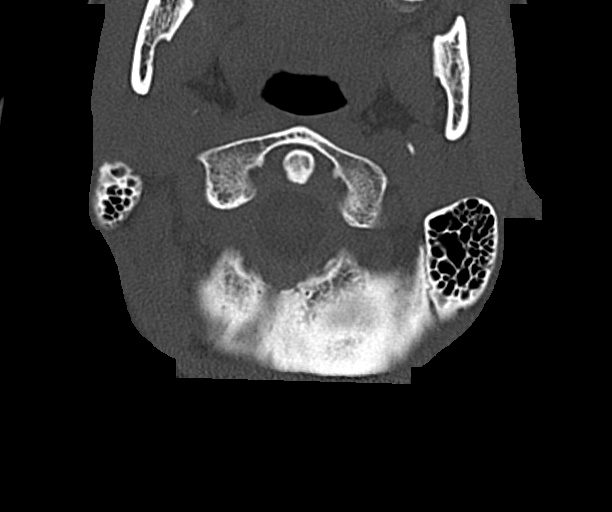

[13 of 33 positions shown; findings below may reference images not displayed]

FINDINGS: Alignment: Mild reversal the normal cervical lordosis. No
substantial sagittal subluxation.

Skull base and vertebrae: Vertebral body heights are maintained. No
evidence of acute fracture.

Soft tissues and spinal canal: No prevertebral fluid or swelling. No
visible canal hematoma.

Disc levels:  No significant focal degenerative change.

Upper chest: Visualized lung apices are clear.
IMPRESSION: No evidence of acute fracture or traumatic malalignment.

## 2023-05-09 ENCOUNTER — Encounter: Payer: Self-pay | Admitting: Emergency Medicine

## 2023-05-09 ENCOUNTER — Ambulatory Visit
Admission: EM | Admit: 2023-05-09 | Discharge: 2023-05-09 | Disposition: A | Discharge status: Home / Self Care | Payer: Medicaid Other | Attending: Family Medicine | Admitting: Family Medicine

## 2023-05-09 DIAGNOSIS — N3001 Acute cystitis with hematuria: Secondary | ICD-10-CM | POA: Insufficient documentation

## 2023-05-09 DIAGNOSIS — H66002 Acute suppurative otitis media without spontaneous rupture of ear drum, left ear: Secondary | ICD-10-CM | POA: Diagnosis present

## 2023-05-09 LAB — URINALYSIS, W/ REFLEX TO CULTURE (INFECTION SUSPECTED)
Glucose, UA: 100 mg/dL — AB
Nitrite: NEGATIVE
Protein, ur: 100 mg/dL — AB
Specific Gravity, Urine: 1.025 (ref 1.005–1.030)
WBC, UA: >50 WBC/hpf (ref 0–5)
pH: 6.5 (ref 5.0–8.0)

## 2023-05-09 MED ORDER — CEFDINIR 300 MG PO CAPS: 300.0000 mg | ORAL_CAPSULE | Freq: Two times a day (BID) | ORAL | 0 refills | Status: DC

## 2023-05-09 NOTE — Discharge Instructions (Signed)
Stop by the pharmacy to pick up your prescriptions.  Follow up with your primary care provider as needed.  

## 2023-05-09 NOTE — ED Provider Notes (Signed)
[facility]    CSN: [id number] Arrival date & time: 05/09/23  1638      History   Chief Complaint Chief Complaint  Patient presents with   Otalgia    Left     HPI Latoya Howell is a 27 y.o. female.   HPI   Latoya Howell presents for left ear pain that started 3 days ago. Feels like the inside is swollen. She put some frozen water on a Q-tip but that didn't help.  with associated blockage, discharge, fullness, hearing loss,  pressure, tinnitus, and wax.    Has a UTI symptoms. Has stinging sensation with urination. She has urinary frequency and urgency but only small amounts of urine come out. Thought she wasn't drinking that much water. Denies vomiting, nausea, fever, chills, back pain, pelvic pain.     Past Medical History:  Diagnosis Date   Anemia    History of premature delivery    at 36 wks 2 d    There are no problems to display for this patient.   Past Surgical History:  Procedure Laterality Date   denies surgical hx      OB History     Gravida  3   Para  2   Term      Preterm      AB  1   Living  2      SAB      IAB      Ectopic      Multiple      Live Births  2            Home Medications    Prior to Admission medications   Medication Sig Start Date End Date Taking? Authorizing Provider  cefdinir (OMNICEF) 300 MG capsule Take 1 capsule (300 mg total) by mouth 2 (two) times daily. 05/09/23  Yes Jennaya Pogue, Seward Meth, DO  Methenamine-Sodium Salicylate (CYSTEX PO) Take by mouth. Has been taking since friday    [provider]  ondansetron (ZOFRAN-ODT) 4 MG disintegrating tablet Take 1 tablet (4 mg total) by mouth every 8 (eight) hours as needed for nausea or vomiting. 11/13/22   Domenick Gong, MD    Family History Family History  Problem Relation Age of Onset   Diabetes Maternal Grandmother    Diabetes Maternal Grandfather    Diabetes Maternal Aunt     Social History Social History   Tobacco Use   Smoking  status: Never   Smokeless tobacco: Never  Vaping Use   Vaping Use: Never used  Substance Use Topics   Alcohol use: Not Currently    Alcohol/week: 1.0 standard drink of alcohol    Types: 1 Glasses of wine per week    Comment: Birthday in January 2023   Drug use: Not Currently    Types: Marijuana    Comment: Last use Thanksgiving 2020.     Allergies   Patient has no known allergies.   Review of Systems Review of Systems: :negative unless otherwise stated in HPI.      Physical Exam Triage Vital Signs ED Triage Vitals  Enc Vitals Group     BP 05/09/23 1659 126/69     Pulse Rate 05/09/23 1659 74     Resp 05/09/23 1659 14     Temp 05/09/23 1659 98.5 F (36.9 C)     Temp Source 05/09/23 1659 Oral     SpO2 05/09/23 1659 100 %     Weight 05/09/23 1657 109 lb 2 oz (49.5 kg)  Height 05/09/23 1657 5\' 2"  (1.575 m)     Head Circumference --      Peak Flow --      Pain Score 05/09/23 1657 7     Pain Loc --      Pain Edu? --      Excl. in GC? --    No data found.  Updated Vital Signs BP 126/69 (BP Location: Left Arm)   Pulse 74   Temp 98.5 F (36.9 C) (Oral)   Resp 14   Ht 5\' 2"  (1.575 m)   Wt 49.5 kg   LMP 04/25/2023 (Approximate)   SpO2 100%   Breastfeeding No   BMI 19.96 kg/m   Visual Acuity Right Eye Distance:   Left Eye Distance:   Bilateral Distance:    Right Eye Near:   Left Eye Near:    Bilateral Near:     Physical Exam GEN:     alert, non-toxic appearing female in no distress    HENT:  mucus membranes moist, oropharyngeal without lesions or exudate, no tonsillar hypertrophy,  no erythema, right TM normal, left TM erythematous and opaque, normal external auditory canals bilaterally, nontender tragus   EYES:   no scleral injection or discharge NECK:  normal ROM, no lymphadenopathy, no meningismus   RESP:  no increased work of breathing, clear to auscultation bilaterally,   CVS:   regular rate and rhythm ABD:    Soft, suprapubic tenderness,  nondistended, no guarding, no rebound, no CVA tenderness Skin:   warm and dry, no rash on visible skin    UC Treatments / Results  Labs (all labs ordered are listed, but only abnormal results are displayed) Labs Reviewed  URINALYSIS, W/ REFLEX TO CULTURE (INFECTION SUSPECTED) - Abnormal; Notable for the following components:      Result Value   Color, Urine AMBER (*)    APPearance HAZY (*)    Glucose, UA 100 (*)    Hgb urine dipstick SMALL (*)    Bilirubin Urine SMALL (*)    Ketones, ur TRACE (*)    Protein, ur 100 (*)    Leukocytes,Ua MODERATE (*)    Bacteria, UA FEW (*)    All other components within normal limits    EKG   Radiology No results found.  Procedures Procedures (including critical care time)  Medications Ordered in UC Medications - No data to display  Initial Impression / Assessment and Plan / UC Course  I have reviewed the triage vital signs and the nursing notes.  Pertinent labs & imaging results that were available during my care of the patient were reviewed by me and considered in my medical decision making (see chart for details).        Acute Otitis media Overall patient is well-appearing, well-hydrated and without respiratory distress. Kailyn is afebrile.  Vital signs stable.  She has evidence of left acute otitis media. Tylenol/Motrin's as needed for fever or discomfort.  Stressed importance of hydration.   Additionally, presents for acute onset dysuria last night.  Vital signs stable.  U consistent with acute cystitis.  Hematuria supported on microscopy.     Given her current prominent left acute otitis media will cover with cefdinir for both the otitis media and the acute cystitis.  Return precautions including abdominal pain, fever, chills, nausea, or vomiting given.   Discussed MDM, treatment plan and plan for follow-up with patient who agrees with plan.   Final Clinical Impressions(s) / UC Diagnoses   Final diagnoses:  Non-recurrent  acute suppurative otitis media of left ear without spontaneous rupture of tympanic membrane  Acute cystitis with hematuria     Discharge Instructions      Stop by the pharmacy to pick up your prescriptions.  Follow up with your primary care provider as needed.      ED Prescriptions     Medication Sig Dispense Auth. Provider   cefdinir (OMNICEF) 300 MG capsule Take 1 capsule (300 mg total) by mouth 2 (two) times daily. 20 capsule Katha Cabal, DO      PDMP not reviewed this encounter.   Katha Cabal, DO 05/23/23 2337

## 2023-05-09 NOTE — ED Triage Notes (Signed)
Patient reports left ear pain that started yesterday.  Patient reports some swelling in her left ear that started this morning.  Patient reports dysuria that started last night.

## 2023-06-04 ENCOUNTER — Ambulatory Visit
Admission: EM | Admit: 2023-06-04 | Discharge: 2023-06-04 | Disposition: A | Discharge status: Home / Self Care | Payer: Medicaid Other | Attending: Family Medicine | Admitting: Family Medicine

## 2023-06-04 DIAGNOSIS — H1032 Unspecified acute conjunctivitis, left eye: Secondary | ICD-10-CM | POA: Diagnosis not present

## 2023-06-04 MED ORDER — POLYMYXIN B-TRIMETHOPRIM 10000-0.1 UNIT/ML-% OP SOLN: 2.0000 [drp] | Freq: Four times a day (QID) | OPHTHALMIC | 0 refills | Status: DC

## 2023-06-04 NOTE — Discharge Instructions (Signed)
Stop by the pharmacy to pick up your antibiotic eye medication.  Follow up with your primary eyecare provider or [facility] if symptoms suddenly worsen or you have little improvement in your eye symptoms.    

## 2023-06-04 NOTE — ED Triage Notes (Signed)
Patient presents to [facility] for left eye irritation x 1 day. Concerned with pink eye.

## 2023-06-04 NOTE — ED Provider Notes (Signed)
[facility]    CSN: [id number] Arrival date & time: 06/04/23  4098      History   Chief Complaint Chief Complaint  Patient presents with   Eye Problem    HPI HPI  Latoya Howell is a 27 y.o. female.   History provided by patient.  Latoya Howell presents for left eye irritation that started yesterday. Her duaghter has pink eye and she doesn't want her eye to get worse.No treatments tried prior to arrival. Latoya Howell does not wear glasses or contacts.  Latoya Howell has not had any trouble seeing or discharge from the eye.  Latoya Howell has otherwise been well and has no additional concerns today.     Past Medical History:  Diagnosis Date   Anemia    History of premature delivery    at 36 wks 2 d    There are no problems to display for this patient.   Past Surgical History:  Procedure Laterality Date   denies surgical hx      OB History     Gravida  3   Para  2   Term      Preterm      AB  1   Living  2      SAB      IAB      Ectopic      Multiple      Live Births  2            Home Medications    Prior to Admission medications   Medication Sig Start Date End Date Taking? Authorizing Provider  trimethoprim-polymyxin b (POLYTRIM) ophthalmic solution Place 2 drops into the left eye every 6 (six) hours. For 7 days 06/04/23  Yes Treylon Henard, DO  Methenamine-Sodium Salicylate (CYSTEX PO) Take by mouth. Has been taking since friday    [provider]    Family History Family History  Problem Relation Age of Onset   Diabetes Maternal Grandmother    Diabetes Maternal Grandfather    Diabetes Maternal Aunt     Social History Social History   Tobacco Use   Smoking status: Never   Smokeless tobacco: Never  Vaping Use   Vaping Use: Never used  Substance Use Topics   Alcohol use: Not Currently    Alcohol/week: 1.0 standard drink of alcohol    Types: 1 Glasses of wine per week    Comment: Birthday in January 2023   Drug use: Not Currently     Types: Marijuana    Comment: Last use Thanksgiving 2020.     Allergies   Patient has no known allergies.   Review of Systems Review of Systems : negative unless otherwise stated in HPI.      Physical Exam Triage Vital Signs ED Triage Vitals  Enc Vitals Group     BP 06/04/23 1000 129/82     Pulse Rate 06/04/23 1000 68     Resp 06/04/23 1000 18     Temp 06/04/23 1000 99.1 F (37.3 C)     Temp Source 06/04/23 1000 Oral     SpO2 06/04/23 1000 99 %     Weight --      Height --      Head Circumference --      Peak Flow --      Pain Score 06/04/23 0956 0     Pain Loc --      Pain Edu? --      Excl. in GC? --  No data found.  Updated Vital Signs BP 129/82 (BP Location: Left Arm)   Pulse 68   Temp 99.1 F (37.3 C) (Oral)   Resp 18   LMP 05/21/2023 (Approximate)   SpO2 99%   Visual Acuity Right Eye Distance:   Left Eye Distance:   Bilateral Distance:    Right Eye Near:   Left Eye Near:    Bilateral Near:     Physical Exam  GEN: pleasant well appearing female, in no acute distress  NECK: normal ROM  RESP: no increased work of breathing EYES:     General: Lids are normal. Lids are everted, no foreign bodies appreciated. Vision grossly intact. Gaze aligned appropriately.        Right eye: No discharge. o foreign body, discharge or hordeolum.        Left eye: No foreign body, discharge or hordeolum.     Extraocular Movements: Extraocular movements intact.     Conjunctiva/sclera:     Left eye: Left conjunctiva is injected. No chemosis or hemorrhage. SKIN: warm and dry   UC Treatments / Results  Labs (all labs ordered are listed, but only abnormal results are displayed) Labs Reviewed - No data to display  EKG   Radiology No results found.  Procedures Procedures (including critical care time)  Medications Ordered in UC Medications - No data to display  Initial Impression / Assessment and Plan / UC Course  I have reviewed the triage vital  signs and the nursing notes.  Pertinent labs & imaging results that were available during my care of the patient were reviewed by me and considered in my medical decision making (see chart for details).     Patient is a 27 y.o. female who presents after left irritation for the past 1-2 days.  On exam, she has a possible beginning conjunctivitis on the left. Treat with Polytrim eye drops to be used after 72 hours if symptoms get worse.  Advised to follow-up with an ophthalmologist or optometrist, if  discomfort/pain is not improving after 7-day course.  Understanding voiced.   Discussed MDM, treatment plan and plan for follow-up with patient who agrees with plan.  Final Clinical Impressions(s) / UC Diagnoses   Final diagnoses:  Acute conjunctivitis of left eye, unspecified acute conjunctivitis type     Discharge Instructions      Stop by the pharmacy to pick up your antibiotic eye medication.  Follow up with your primary eyecare provider or [facility] if symptoms suddenly worsen or you have little improvement in your eye symptoms.       ED Prescriptions     Medication Sig Dispense Auth. Provider   trimethoprim-polymyxin b (POLYTRIM) ophthalmic solution Place 2 drops into the left eye every 6 (six) hours. For 7 days 10 mL Katha Cabal, DO      PDMP not reviewed this encounter.   Katha Cabal, DO 06/04/23 1048

## 2023-08-18 ENCOUNTER — Encounter: Payer: Self-pay | Admitting: Advanced Practice Midwife

## 2023-08-18 ENCOUNTER — Ambulatory Visit: Payer: Medicaid Other | Admitting: Advanced Practice Midwife

## 2023-08-18 VITALS — Ht 61.0 in | Wt 106.0 lb

## 2023-08-18 DIAGNOSIS — N912 Amenorrhea, unspecified: Secondary | ICD-10-CM

## 2023-08-18 DIAGNOSIS — Z369 Encounter for antenatal screening, unspecified: Secondary | ICD-10-CM

## 2023-08-18 DIAGNOSIS — Z3200 Encounter for pregnancy test, result unknown: Secondary | ICD-10-CM

## 2023-08-18 DIAGNOSIS — Z3490 Encounter for supervision of normal pregnancy, unspecified, unspecified trimester: Secondary | ICD-10-CM | POA: Insufficient documentation

## 2023-08-18 DIAGNOSIS — O09899 Supervision of other high risk pregnancies, unspecified trimester: Secondary | ICD-10-CM | POA: Insufficient documentation

## 2023-08-18 DIAGNOSIS — Z3201 Encounter for pregnancy test, result positive: Secondary | ICD-10-CM | POA: Diagnosis not present

## 2023-08-18 DIAGNOSIS — O219 Vomiting of pregnancy, unspecified: Secondary | ICD-10-CM

## 2023-08-18 LAB — POCT URINE PREGNANCY: Preg Test, Ur: POSITIVE — AB

## 2023-08-18 MED ORDER — ONDANSETRON 4 MG PO TBDP
4.0000 mg | ORAL_TABLET | Freq: Four times a day (QID) | ORAL | 2 refills | Status: AC | PRN
Start: 2023-08-18 — End: ?

## 2023-08-18 NOTE — Progress Notes (Signed)
Patient ID: Latoya Howell, female   DOB: 1996/04/24, 27 y.o.   MRN: [id number]  Reason for Consult: Establish Care and Possible Pregnancy   Subjective:  HPI:  Latoya Howell is a 27 y.o. female being seen for pregnancy confirmation. She took a positive home pregnancy test about 2 weeks ago after missing her period. She has a positive urine test in the office today. She is having nausea and vomiting in the past few weeks and vomits after most PO intake. She is able to keep some food and drink down. She mentions a history of anemia and is unable to tolerate PNV currently. Will check labs soon.  Past Medical History:  Diagnosis Date   Anemia    History of premature delivery    at 36 wks 2 d   Family History  Problem Relation Age of Onset   Diabetes Maternal Grandmother    Diabetes Maternal Grandfather    Diabetes Maternal Aunt    Past Surgical History:  Procedure Laterality Date   denies surgical hx      Short Social History:  Social History   Tobacco Use   Smoking status: Never   Smokeless tobacco: Never  Substance Use Topics   Alcohol use: Not Currently    Alcohol/week: 1.0 standard drink of alcohol    Types: 1 Glasses of wine per week    Comment: Birthday in January 2023    No Known Allergies  Current Outpatient Medications  Medication Sig Dispense Refill   ondansetron (ZOFRAN-ODT) 4 MG disintegrating tablet Take 1 tablet (4 mg total) by mouth every 6 (six) hours as needed for nausea. 20 tablet 2   No current facility-administered medications for this visit.    Review of Systems  Constitutional:  Positive for malaise/fatigue and weight loss. Negative for chills and fever.  HENT:  Negative for congestion, ear discharge, ear pain, hearing loss, sinus pain and sore throat.   Eyes:  Negative for blurred vision and double vision.  Respiratory:  Negative for cough, shortness of breath and wheezing.   Cardiovascular:  Negative for chest pain, palpitations and leg  swelling.  Gastrointestinal:  Positive for nausea and vomiting. Negative for abdominal pain, blood in stool, constipation, diarrhea, heartburn and melena.  Genitourinary:  Negative for dysuria, flank pain, frequency, hematuria and urgency.  Musculoskeletal:  Negative for back pain, joint pain and myalgias.  Skin:  Negative for itching and rash.  Neurological:  Positive for dizziness and headaches. Negative for tingling, tremors, sensory change, speech change, focal weakness, seizures, loss of consciousness and weakness.  Endo/Heme/Allergies:  Negative for environmental allergies. Does not bruise/bleed easily.  Psychiatric/Behavioral:  Negative for depression, hallucinations, memory loss, substance abuse and suicidal ideas. The patient is not nervous/anxious and does not have insomnia.         Objective:  Objective   Vitals:   08/18/23 0917  Weight: 106 lb (48.1 kg)  Height: 5\' 1"  (1.549 m)   Body mass index is 20.03 kg/m. Constitutional: thin, well developed female in no acute distress.  HEENT: normal Skin: Warm and dry.  Extremity:  no edema   Respiratory: Normal respiratory effort Psych: Alert and Oriented x3. No memory deficits. Normal mood and affect.    Data:  Latest Reference Range & Units 08/18/23 09:28  Preg Test, Ur Negative  Positive !  !: Data is abnormal     Assessment/Plan:     27 y.o. G4 P1112 female with positive urine pregnancy test, nausea and vomiting of pregnancy  Return to clinic in 1 to 2 weeks for NOB visits-labs, ultrasound, initial visit Rx Zofran   Latoya Howell, CNM Latoya Howell 08/18/2023 11:59 AM

## 2023-08-22 ENCOUNTER — Ambulatory Visit: Payer: Medicaid Other

## 2023-08-22 DIAGNOSIS — Z3689 Encounter for other specified antenatal screening: Secondary | ICD-10-CM

## 2023-08-22 DIAGNOSIS — Z3480 Encounter for supervision of other normal pregnancy, unspecified trimester: Secondary | ICD-10-CM

## 2023-08-22 NOTE — Progress Notes (Signed)
New OB Intake  I connected with  Latoya Howell on 08/22/23 at 11:15 AM EDT by telephone and verified that I am speaking with the correct person using two identifiers. Nurse is located at [facility] and pt is located at home.  I explained I am completing New OB Intake today. We discussed her EDD of 04/07/2024 that is based on LMP of 07/02/2023. Pt is G4/P1112. I reviewed her allergies, medications, Medical/Surgical/OB history, and appropriate screenings. There are no cats in the home.  Based on history, this is a/an pregnancy uncomplicated .   Patient Active Problem List   Diagnosis Date Noted   Supervision of other normal pregnancy, antepartum 08/22/2023   History of preterm delivery, currently pregnant 08/18/2023   Supervision of normal pregnancy 08/18/2023    Concerns addressed today None  Delivery Plans:  Plans to deliver at [facility].  Anatomy US Explained first scheduled [place] Sept.4th and an anatomy scan will be done at 20 weeks.  Labs Discussed  genetic screening with patient. Patient thinks she wants  genetic testing to be drawn at new OB visit. Discussed possible labs to be drawn at new OB appointment.  COVID Vaccine Patient has not had COVID vaccine.   Social Determinants of Health Food Insecurity: denies food insecurity Transportation: Patient denies transportation needs. Childcare: Discussed no children allowed at ultrasound appointments.   First visit review I reviewed new OB appt with pt. I explained she will have ob bloodwork and pap smear/pelvic exam if indicated. Explained pt will be seen by an AOB provider at first visit; encounter routed to appropriate provider.   Loran Senters, [place] 08/22/2023  11:48 AM

## 2023-08-22 NOTE — Patient Instructions (Signed)
First Trimester of Pregnancy  The first trimester of pregnancy starts on the first day of your last menstrual period until the end of week 12. This is also called months 1 through 3 of pregnancy. Body changes during your first trimester Your body goes through many changes during pregnancy. The changes usually return to normal after your baby is born. Physical changes You may gain or lose weight. Your breasts may grow larger and hurt. The area around your nipples may get darker. Dark spots or blotches may develop on your face. You may have changes in your hair. Health changes You may feel like you might vomit (nauseous), and you may vomit. You may have heartburn. You may have headaches. You may have trouble pooping (constipation). Your gums may bleed. Other changes You may get tired easily. You may pee (urinate) more often. Your menstrual periods will stop. You may not feel hungry. You may want to eat certain kinds of food. You may have changes in your emotions from day to day. You may have more dreams. Follow these instructions at home: Medicines Take over-the-counter and prescription medicines only as told by your doctor. Some medicines are not safe during pregnancy. Take a prenatal vitamin that contains at least 600 micrograms (mcg) of folic acid. Eating and drinking Eat healthy meals that include: Fresh fruits and vegetables. Whole grains. Good sources of protein, such as meat, eggs, or tofu. Low-fat dairy products. Avoid raw meat and unpasteurized juice, milk, and cheese. If you feel like you may vomit, or you vomit: Eat 4 or 5 small meals a day instead of 3 large meals. Try eating a few soda crackers. Drink liquids between meals instead of during meals. You may need to take these actions to prevent or treat trouble pooping: Drink enough fluids to keep your pee (urine) pale yellow. Eat foods that are high in fiber. These include beans, whole grains, and fresh fruits and  vegetables. Limit foods that are high in fat and sugar. These include fried or sweet foods. Activity Exercise only as told by your doctor. Most people can do their usual exercise routine during pregnancy. Stop exercising if you have cramps or pain in your lower belly (abdomen) or low back. Do not exercise if it is too hot or too humid, or if you are in a place of great height (high altitude). Avoid heavy lifting. If you choose to, you may have sex unless your doctor tells you not to. Relieving pain and discomfort Wear a good support bra if your breasts are sore. Rest with your legs raised (elevated) if you have leg cramps or low back pain. If you have bulging veins (varicose veins) in your legs: Wear support hose as told by your doctor. Raise your feet for 15 minutes, 3-4 times a day. Limit salt in your food. Safety Wear your seat belt at all times when you are in a car. Talk with your doctor if someone is hurting you or yelling at you. Talk with your doctor if you are feeling sad or have thoughts of hurting yourself. Lifestyle Do not use hot tubs, steam rooms, or saunas. Do not douche. Do not use tampons or scented sanitary pads. Do not use herbal medicines, illegal drugs, or medicines that are not approved by your doctor. Do not drink alcohol. Do not smoke or use any products that contain nicotine or tobacco. If you need help quitting, ask your doctor. Avoid cat litter boxes and soil that is used by cats. These carry   germs that can cause harm to the baby and can cause a loss of your baby by miscarriage or stillbirth. General instructions Keep all follow-up visits. This is important. Ask for help if you need counseling or if you need help with nutrition. Your doctor can give you advice or tell you where to go for help. Visit your dentist. At home, brush your teeth with a soft toothbrush. Floss gently. Write down your questions. Take them to your prenatal visits. Where to find more  information American Pregnancy Association: americanpregnancy.org American College of Obstetricians and Gynecologists: www.acog.org Office on Women's Health: womenshealth.gov/pregnancy Contact a doctor if: You are dizzy. You have a fever. You have mild cramps or pressure in your lower belly. You have a nagging pain in your belly area. You continue to feel like you may vomit, you vomit, or you have watery poop (diarrhea) for 24 hours or longer. You have a bad-smelling fluid coming from your vagina. You have pain when you pee. You are exposed to a disease that spreads from person to person, such as chickenpox, measles, Zika virus, HIV, or hepatitis. Get help right away if: You have spotting or bleeding from your vagina. You have very bad belly cramping or pain. You have shortness of breath or chest pain. You have any kind of injury, such as from a fall or a car crash. You have new or increased pain, swelling, or redness in an arm or leg. Summary The first trimester of pregnancy starts on the first day of your last menstrual period until the end of week 12 (months 1 through 3). Eat 4 or 5 small meals a day instead of 3 large meals. Do not smoke or use any products that contain nicotine or tobacco. If you need help quitting, ask your doctor. Keep all follow-up visits. This information is not intended to replace advice given to you by your health care provider. Make sure you discuss any questions you have with your health care provider. Document Revised: 05/17/2020 Document Reviewed: 03/23/2020 Elsevier Patient Education  2024 [employer] Commonly Asked Questions During Pregnancy  Cats: A parasite can be excreted in cat feces.  To avoid exposure you need to have another person empty the little box.  If you must empty the litter box you will need to wear gloves.  Wash your hands after handling your cat.  This parasite can also be found in raw or undercooked meat so this should also be  avoided.  Colds, Sore Throats, Flu: Please check your medication sheet to see what you can take for symptoms.  If your symptoms are unrelieved by these medications please call the office.  Dental Work: Most any dental work your dentist recommends is permitted.  X-rays should only be taken during the first trimester if absolutely necessary.  Your abdomen should be shielded with a lead apron during all x-rays.  Please notify your provider prior to receiving any x-rays.  Novocaine is fine; gas is not recommended.  If your dentist requires a note from us prior to dental work please call the office and we will provide one for you.  Exercise: Exercise is an important part of staying healthy during your pregnancy.  You may continue most exercises you were accustomed to prior to pregnancy.  Later in your pregnancy you will most likely notice you have difficulty with activities requiring balance like riding a bicycle.  It is important that you listen to your body and avoid activities that put you at a higher   risk of falling.  Adequate rest and staying well hydrated are a must!  If you have questions about the safety of specific activities ask your provider.    Exposure to Children with illness: Try to avoid obvious exposure; report any symptoms to us when noted,  If you have chicken pos, red measles or mumps, you should be immune to these diseases.   Please do not take any vaccines while pregnant unless you have checked with your OB provider.  Fetal Movement: After 28 weeks we recommend you do "kick counts" twice daily.  Lie or sit down in a calm quiet environment and count your baby movements "kicks".  You should feel your baby at least 10 times per hour.  If you have not felt 10 kicks within the first hour get up, walk around and have something sweet to eat or drink then repeat for an additional hour.  If count remains less than 10 per hour notify your provider.  Fumigating: Follow your pest control agent's  advice as to how long to stay out of your home.  Ventilate the area well before re-entering.  Hemorrhoids:   Most over-the-counter preparations can be used during pregnancy.  Check your medication to see what is safe to use.  It is important to use a stool softener or fiber in your diet and to drink lots of liquids.  If hemorrhoids seem to be getting worse please call the office.   Hot Tubs:  Hot tubs Jacuzzis and saunas are not recommended while pregnant.  These increase your internal body temperature and should be avoided.  Intercourse:  Sexual intercourse is safe during pregnancy as long as you are comfortable, unless otherwise advised by your provider.  Spotting may occur after intercourse; report any bright red bleeding that is heavier than spotting.  Labor:  If you know that you are in labor, please go to the hospital.  If you are unsure, please call the office and let us help you decide what to do.  Lifting, straining, etc:  If your job requires heavy lifting or straining please check with your provider for any limitations.  Generally, you should not lift items heavier than that you can lift simply with your hands and arms (no back muscles)  Painting:  Paint fumes do not harm your pregnancy, but may make you ill and should be avoided if possible.  Latex or water based paints have less odor than oils.  Use adequate ventilation while painting.  Permanents & Hair Color:  Chemicals in hair dyes are not recommended as they cause increase hair dryness which can increase hair loss during pregnancy.  " Highlighting" and permanents are allowed.  Dye may be absorbed differently and permanents may not hold as well during pregnancy.  Sunbathing:  Use a sunscreen, as skin burns easily during pregnancy.  Drink plenty of fluids; avoid over heating.  Tanning Beds:  Because their possible side effects are still unknown, tanning beds are not recommended.  Ultrasound Scans:  Routine ultrasounds are performed  at approximately 20 weeks.  You will be able to see your baby's general anatomy an if you would like to know the gender this can usually be determined as well.  If it is questionable when you conceived you may also receive an ultrasound early in your pregnancy for dating purposes.  Otherwise ultrasound exams are not routinely performed unless there is a medical necessity.  Although you can request a scan we ask that you pay for it when   conducted because insurance does not cover " patient request" scans.  Work: If your pregnancy proceeds without complications you may work until your due date, unless your physician or employer advises otherwise.  Round Ligament Pain/Pelvic Discomfort:  Sharp, shooting pains not associated with bleeding are fairly common, usually occurring in the second trimester of pregnancy.  They tend to be worse when standing up or when you remain standing for long periods of time.  These are the result of pressure of certain pelvic ligaments called "round ligaments".  Rest, Tylenol and heat seem to be the most effective relief.  As the womb and fetus grow, they rise out of the pelvis and the discomfort improves.  Please notify the office if your pain seems different than that described.  It may represent a more serious condition.  Common Medications Safe in Pregnancy  Acne:      Constipation:  Benzoyl Peroxide     Colace  Clindamycin      Dulcolax Suppository  Topica Erythromycin     Fibercon  Salicylic Acid      Metamucil         Miralax AVOID:        Senakot   Accutane    Cough:  Retin-A       Cough Drops  Tetracycline      Phenergan w/ Codeine if Rx  Minocycline      Robitussin (Plain & DM)  Antibiotics:     Crabs/Lice:  Ceclor       RID  Cephalosporins    AVOID:  E-Mycins      Kwell  Keflex  Macrobid/Macrodantin   Diarrhea:  Penicillin      Kao-Pectate  Zithromax      Imodium AD         PUSH FLUIDS AVOID:       Cipro     Fever:  Tetracycline      Tylenol (Regular  or Extra  Minocycline       Strength)  Levaquin      Extra Strength-Do not          Exceed 8 tabs/24 hrs Caffeine:        <200mg/day (equiv. To 1 cup of coffee or  approx. 3 12 oz sodas)         Gas: Cold/Hayfever:       Gas-X  Benadryl      Mylicon  Claritin       Phazyme  **Claritin-D        Chlor-Trimeton    Headaches:  Dimetapp      ASA-Free Excedrin  Drixoral-Non-Drowsy     Cold Compress  Mucinex (Guaifenasin)     Tylenol (Regular or Extra  Sudafed/Sudafed-12 Hour     Strength)  **Sudafed PE Pseudoephedrine   Tylenol Cold & Sinus     Vicks Vapor Rub  Zyrtec  **AVOID if Problems With Blood Pressure         Heartburn: Avoid lying down for at least 1 hour after meals  Aciphex      Maalox     Rash:  Milk of Magnesia     Benadryl    Mylanta       1% Hydrocortisone Cream  Pepcid  Pepcid Complete   Sleep Aids:  Prevacid      Ambien   Prilosec       Benadryl  Rolaids       Chamomile Tea  Tums (Limit 4/day)     Unisom           Tylenol PM         Warm milk-add vanilla or  Hemorrhoids:       Sugar for taste  Anusol/Anusol H.C.  (RX: Analapram 2.5%)  Sugar Substitutes:  Hydrocortisone OTC     Ok in moderation  Preparation H      Tucks        Vaseline lotion applied to tissue with wiping    Herpes:     Throat:  Acyclovir      Oragel  Famvir  Valtrex     Vaccines:         Flu Shot Leg Cramps:       *Gardasil  Benadryl      Hepatitis A         Hepatitis B Nasal Spray:       Pneumovax  Saline Nasal Spray     Polio Booster         Tetanus Nausea:       Tuberculosis test or PPD  Vitamin B6 25 mg TID   AVOID:    Dramamine      *Gardasil  Emetrol       Live Poliovirus  Ginger Root 250 mg QID    MMR (measles, mumps &  High Complex Carbs @ Bedtime    rebella)  Sea Bands-Accupressure    Varicella (Chickenpox)  Unisom 1/2 tab TID     *No known complications           If received before Pain:         Known pregnancy;   Darvocet       Resume series  after  Lortab        Delivery  Percocet    Yeast:   Tramadol      Femstat  Tylenol 3      Gyne-lotrimin  Ultram       Monistat  Vicodin           MISC:         All Sunscreens           Hair Coloring/highlights          Insect Repellant's          (Including DEET)         Mystic Tans  

## 2023-08-27 ENCOUNTER — Ambulatory Visit
Admission: RE | Admit: 2023-08-27 | Discharge: 2023-08-27 | Disposition: A | Discharge status: Home / Self Care | Payer: Medicaid Other | Source: Ambulatory Visit | Attending: Advanced Practice Midwife | Admitting: Advanced Practice Midwife

## 2023-08-27 ENCOUNTER — Other Ambulatory Visit: Payer: Self-pay | Admitting: Advanced Practice Midwife

## 2023-08-27 DIAGNOSIS — Z3200 Encounter for pregnancy test, result unknown: Secondary | ICD-10-CM

## 2023-08-27 DIAGNOSIS — Z3201 Encounter for pregnancy test, result positive: Secondary | ICD-10-CM | POA: Diagnosis present

## 2023-08-27 DIAGNOSIS — Z369 Encounter for antenatal screening, unspecified: Secondary | ICD-10-CM

## 2023-08-27 DIAGNOSIS — O219 Vomiting of pregnancy, unspecified: Secondary | ICD-10-CM

## 2023-09-05 ENCOUNTER — Encounter: Payer: Medicaid Other | Admitting: Licensed Practical Nurse

## 2023-09-06 ENCOUNTER — Encounter: Payer: Self-pay | Admitting: Emergency Medicine

## 2023-09-06 ENCOUNTER — Ambulatory Visit
Admission: EM | Admit: 2023-09-06 | Discharge: 2023-09-06 | Disposition: A | Discharge status: Home / Self Care | Payer: Medicaid Other | Attending: Physician Assistant | Admitting: Physician Assistant

## 2023-09-06 DIAGNOSIS — Z3A09 9 weeks gestation of pregnancy: Secondary | ICD-10-CM | POA: Diagnosis not present

## 2023-09-06 DIAGNOSIS — O98511 Other viral diseases complicating pregnancy, first trimester: Secondary | ICD-10-CM

## 2023-09-06 DIAGNOSIS — U071 COVID-19: Secondary | ICD-10-CM

## 2023-09-06 DIAGNOSIS — R051 Acute cough: Secondary | ICD-10-CM | POA: Insufficient documentation

## 2023-09-06 DIAGNOSIS — Z3491 Encounter for supervision of normal pregnancy, unspecified, first trimester: Secondary | ICD-10-CM | POA: Diagnosis present

## 2023-09-06 DIAGNOSIS — R509 Fever, unspecified: Secondary | ICD-10-CM | POA: Insufficient documentation

## 2023-09-06 LAB — SARS CORONAVIRUS 2 BY RT PCR: SARS Coronavirus 2 by RT PCR: POSITIVE — AB

## 2023-09-06 NOTE — ED Triage Notes (Signed)
Patient c/o nasal congestion, cough, and chest congestion that started 2 days ago.  Patient is currently pregnant.  Patient woke up with constant headache.

## 2023-09-06 NOTE — ED Provider Notes (Signed)
[facility]    CSN: [id number] Arrival date & time: 09/06/23  1526      History   Chief Complaint Chief Complaint  Patient presents with   Cough   Nasal Congestion    HPI Latoya Howell is a 27 y.o. female presenting for 2-day history of low-grade fever, fatigue, body aches, cough, congestion, mild sore throat.  Reports cough is productive.  Denies any breathing difficulty or wheezing, vomiting or diarrhea.  Has had some nausea but is reportedly [redacted] weeks pregnant.  She has been taking Zofran as prescribed by OB/GYN but no other medications.  Has been exposed to COVID.  HPI  Past Medical History:  Diagnosis Date   Anemia    History of premature delivery    at 42 wks 2 d    Patient Active Problem List   Diagnosis Date Noted   Supervision of other normal pregnancy, antepartum 08/22/2023   History of preterm delivery, currently pregnant 08/18/2023   Supervision of normal pregnancy 08/18/2023    Past Surgical History:  Procedure Laterality Date   denies surgical hx      OB History     Gravida  4   Para  2   Term  1   Preterm  1   AB  1   Living  2      SAB      IAB      Ectopic      Multiple      Live Births  2            Home Medications    Prior to Admission medications   Medication Sig Start Date End Date Taking? Authorizing Provider  ondansetron (ZOFRAN-ODT) 4 MG disintegrating tablet Take 1 tablet (4 mg total) by mouth every 6 (six) hours as needed for nausea. 08/18/23   Tresea Mall, CNM    Family History Family History  Problem Relation Age of Onset   Diabetes Mother    Healthy Sister    Healthy Brother    Diabetes Maternal Grandmother    Diabetes Maternal Grandfather    Diabetes Maternal Aunt     Social History Social History   Tobacco Use   Smoking status: Never   Smokeless tobacco: Never  Vaping Use   Vaping status: Never Used  Substance Use Topics   Alcohol use: Not Currently    Alcohol/week: 1.0  standard drink of alcohol    Types: 1 Glasses of wine per week    Comment: Birthday in January 2023   Drug use: Not Currently    Types: Marijuana    Comment: Last use Thanksgiving 2020.     Allergies   Patient has no known allergies.   Review of Systems Review of Systems  Constitutional:  Positive for chills, fatigue and fever. Negative for diaphoresis.  HENT:  Positive for congestion, rhinorrhea and sore throat. Negative for ear pain, sinus pressure and sinus pain.   Respiratory:  Positive for cough. Negative for shortness of breath.   Cardiovascular:  Negative for chest pain.  Gastrointestinal:  Negative for abdominal pain, nausea and vomiting.  Musculoskeletal:  Negative for arthralgias and myalgias.  Skin:  Negative for rash.  Neurological:  Positive for dizziness, light-headedness and headaches. Negative for weakness.  Hematological:  Negative for adenopathy.     Physical Exam Triage Vital Signs ED Triage Vitals  Encounter Vitals Group     BP      Systolic BP Percentile  Diastolic BP Percentile      Pulse      Resp      Temp      Temp src      SpO2      Weight      Height      Head Circumference      Peak Flow      Pain Score      Pain Loc      Pain Education      Exclude from Growth Chart    No data found.  Updated Vital Signs BP 122/66 (BP Location: Right Arm)   Pulse (!) 102   Temp 100 F (37.8 C) (Oral)   Resp 14   Ht 5\' 1"  (1.549 m)   Wt 106 lb 0.7 oz (48.1 kg)   LMP 07/02/2023 (Exact Date)   SpO2 98%   BMI 20.04 kg/m   Physical Exam Vitals and nursing note reviewed.  Constitutional:      General: She is not in acute distress.    Appearance: Normal appearance. She is ill-appearing. She is not toxic-appearing.  HENT:     Head: Normocephalic and atraumatic.     Nose: Congestion present.     Mouth/Throat:     Mouth: Mucous membranes are moist.     Pharynx: Oropharynx is clear. Posterior oropharyngeal erythema present.  Eyes:      General: No scleral icterus.       Right eye: No discharge.        Left eye: No discharge.     Conjunctiva/sclera: Conjunctivae normal.  Cardiovascular:     Rate and Rhythm: Regular rhythm. Tachycardia present.     Heart sounds: Normal heart sounds.  Pulmonary:     Effort: Pulmonary effort is normal. No respiratory distress.     Breath sounds: Normal breath sounds.  Musculoskeletal:     Cervical back: Neck supple.  Skin:    General: Skin is dry.  Neurological:     General: No focal deficit present.     Mental Status: She is alert. Mental status is at baseline.     Motor: No weakness.     Gait: Gait normal.  Psychiatric:        Mood and Affect: Mood normal.        Behavior: Behavior normal.        Thought Content: Thought content normal.      UC Treatments / Results  Labs (all labs ordered are listed, but only abnormal results are displayed) Labs Reviewed  SARS CORONAVIRUS 2 BY RT PCR - Abnormal; Notable for the following components:      Result Value   SARS Coronavirus 2 by RT PCR POSITIVE (*)    All other components within normal limits    EKG   Radiology No results found.  Procedures Procedures (including critical care time)  Medications Ordered in UC Medications - No data to display  Initial Impression / Assessment and Plan / UC Course  I have reviewed the triage vital signs and the nursing notes.  Pertinent labs & imaging results that were available during my care of the patient were reviewed by me and considered in my medical decision making (see chart for details).   27 year old female presents for low-grade fever, fatigue, cough, congestion, dizziness x 2 days. [redacted] weeks pregnant. Exposed to COVID.  Patient is ill-appearing but nontoxic.  Temp 100 degrees.  Pulse elevated at 102 bpm.  On exam has nasal congestion, mild posterior  pharyngeal erythema.  Chest clear.  COVID testing obtained.  Positive.  Discussed results with patient.  Advised her to contact  OB/GYN about her results.  Reviewed current CDC guidelines, isolation protocol and ED precautions.  Advised isolating until fever free for 24 hours and symptoms are improving.  Advised patient she can take Tylenol, Robitussin, antihistamines, nasal saline, throat lozenges, Chloraseptic spray and increase rest and fluids.  Advised to inform OB/GYN that she has COVID.  Reviewed going to emergency department if uncontrolled fever, weakness or breathing trouble.   Final Clinical Impressions(s) / UC Diagnoses   Final diagnoses:  COVID-19  Acute cough  Low grade fever  First trimester pregnancy     Discharge Instructions      Positive COVID.   Reviewed current CDC guidelines, isolation protocol and ED precautions.  Advised isolating until fever free for 24 hours and symptoms are improving.  You can take Tylenol, Robitussin, antihistamines, nasal saline, throat lozenges, Chloraseptic spray and increase rest and fluids.  Advised to inform OB/GYN that you have COVID.  Reviewed going to emergency department if uncontrolled fever, weakness or breathing trouble.     ED Prescriptions   None    PDMP not reviewed this encounter.   Shirlee Latch, PA-C 09/07/23 [phone]

## 2023-09-06 NOTE — Discharge Instructions (Addendum)
Positive COVID.   Reviewed current CDC guidelines, isolation protocol and ED precautions.  Advised isolating until fever free for 24 hours and symptoms are improving.  You can take Tylenol, Robitussin, antihistamines, nasal saline, throat lozenges, Chloraseptic spray and increase rest and fluids.  Advised to inform OB/GYN that you have COVID.  Reviewed going to emergency department if uncontrolled fever, weakness or breathing trouble.

## 2023-09-15 ENCOUNTER — Other Ambulatory Visit (HOSPITAL_COMMUNITY)
Admission: RE | Admit: 2023-09-15 | Discharge: 2023-09-15 | Disposition: A | Discharge status: Home / Self Care | Payer: Medicaid Other | Source: Ambulatory Visit | Attending: Obstetrics | Admitting: Obstetrics

## 2023-09-15 ENCOUNTER — Ambulatory Visit (INDEPENDENT_AMBULATORY_CARE_PROVIDER_SITE_OTHER): Payer: Medicaid Other | Admitting: Obstetrics

## 2023-09-15 ENCOUNTER — Encounter: Payer: Self-pay | Admitting: Obstetrics

## 2023-09-15 VITALS — BP 118/71 | HR 85 | Wt 105.0 lb

## 2023-09-15 DIAGNOSIS — Z3480 Encounter for supervision of other normal pregnancy, unspecified trimester: Secondary | ICD-10-CM

## 2023-09-15 DIAGNOSIS — Z0283 Encounter for blood-alcohol and blood-drug test: Secondary | ICD-10-CM

## 2023-09-15 DIAGNOSIS — Z1379 Encounter for other screening for genetic and chromosomal anomalies: Secondary | ICD-10-CM

## 2023-09-15 DIAGNOSIS — Z3A12 12 weeks gestation of pregnancy: Secondary | ICD-10-CM

## 2023-09-15 DIAGNOSIS — Z124 Encounter for screening for malignant neoplasm of cervix: Secondary | ICD-10-CM | POA: Insufficient documentation

## 2023-09-15 DIAGNOSIS — Z3481 Encounter for supervision of other normal pregnancy, first trimester: Secondary | ICD-10-CM | POA: Diagnosis not present

## 2023-09-15 DIAGNOSIS — M6208 Separation of muscle (nontraumatic), other site: Secondary | ICD-10-CM

## 2023-09-15 MED ORDER — OMEPRAZOLE MAGNESIUM 20 MG PO TBEC: 20.0000 mg | DELAYED_RELEASE_TABLET | Freq: Two times a day (BID) | ORAL | 0 refills | Status: AC

## 2023-09-15 NOTE — Progress Notes (Signed)
NEW OB HISTORY AND PHYSICAL  SUBJECTIVE:       Latoya Howell is a 27 y.o. [phone] female, Patient's last menstrual period was 07/02/2023 (exact date)., Estimated Date of Delivery: 03/29/24, [redacted]w[redacted]d, presents today for establishment of Prenatal Care. She reports nausea, acid reflux, and vomiting. She has lost 5 lbs this pregnancy.  Social history Partner/Relationship: FOB involved Living situation: husband is truck Hospital doctor, lives with children Work: Production designer, theatre/television/film at [employer] Exercise: work, walking with kids Substance use: Took CBD gummies recently, denies other substances   Gynecologic History Patient's last menstrual period was 07/02/2023 (exact date). Normal Contraception: none Last Pap: 2021. Results were: normal  Obstetric History OB History  Gravida Para Term Preterm AB Living  4 2 1 1 1 2   SAB IAB Ectopic Multiple Live Births          2    # Outcome Date GA Lbr Len/2nd Weight Sex Type Anes PTL Lv  4 Current           3 AB 09/22/20     TAB     2 Term 07/15/20   7 lb (3.175 kg) F Vag-Spont   LIV  1 Preterm 01/10/14   5 lb (2.268 kg) M Vag-Spont   LIV    Past Medical History:  Diagnosis Date   Anemia    History of premature delivery    at 36 wks 2 d    Past Surgical History:  Procedure Laterality Date   denies surgical hx      Current Outpatient Medications on File Prior to Visit  Medication Sig Dispense Refill   ondansetron (ZOFRAN-ODT) 4 MG disintegrating tablet Take 1 tablet (4 mg total) by mouth every 6 (six) hours as needed for nausea. 20 tablet 2   Prenatal Vit-Fe Fumarate-FA (PRENATAL MULTIVITAMIN) TABS tablet Take 1 tablet by mouth daily at 12 noon.     No current facility-administered medications on file prior to visit.    No Known Allergies  Social History   Socioeconomic History   Marital status: Single    Spouse name: Not on file   Number of children: 2   Years of education: 12   Highest education level: Not on file  Occupational History   Occupation: [employer]   Tobacco Use   Smoking status: Never   Smokeless tobacco: Never  Vaping Use   Vaping status: Never Used  Substance and Sexual Activity   Alcohol use: Not Currently    Alcohol/week: 1.0 standard drink of alcohol    Types: 1 Glasses of wine per week    Comment: Birthday in January 2023   Drug use: Not Currently    Types: Marijuana    Comment: Last use Thanksgiving 2020.   Sexual activity: Yes    Partners: Male    Birth control/protection: None    Comment: last intercourse January 2023  Other Topics Concern   Not on file  Social History Narrative   Not on file   Social Determinants of Health   Financial Resource Strain: Medium Risk (08/22/2023)   Overall Financial Resource Strain (CARDIA)    Difficulty of Paying Living Expenses: Somewhat hard  Food Insecurity: No Food Insecurity (08/22/2023)   Hunger Vital Sign    Worried About Running Out of Food in the Last Year: Never true    Ran Out of Food in the Last Year: Never true  Transportation Needs: No Transportation Needs (08/22/2023)   PRAPARE - Administrator, Civil Service (Medical): No  Lack of Transportation (Non-Medical): No  Physical Activity: Sufficiently Active (08/22/2023)   Exercise Vital Sign    Days of Exercise per Week: 7 days    Minutes of Exercise per Session: 90 min  Stress: No Stress Concern Present (08/22/2023)   [employer] of Occupational Health - Occupational Stress Questionnaire    Feeling of Stress : Not at all  Social Connections: Unknown (08/22/2023)   Social Connection and Isolation Panel [NHANES]    Frequency of Communication with Friends and Family: More than three times a week    Frequency of Social Gatherings with Friends and Family: Three times a week    Attends Religious Services: Never    Active Member of Clubs or Organizations: No    Attends Banker Meetings: Never    Marital Status: Not on file  Intimate Partner Violence: Not At Risk (08/22/2023)   Humiliation,  Afraid, Rape, and Kick questionnaire    Fear of Current or Ex-Partner: No    Emotionally Abused: No    Physically Abused: No    Sexually Abused: No    Family History  Problem Relation Age of Onset   Diabetes Mother    Healthy Sister    Healthy Brother    Diabetes Maternal Grandmother    Diabetes Maternal Grandfather    Diabetes Maternal Aunt     The following portions of the patient's history were reviewed and updated as appropriate: allergies, current medications, past OB history, past medical history, past surgical history, past family history, past social history, and problem list.  Constitutional: Denied constitutional symptoms, night sweats, recent illness, fatigue, fever, insomnia  Eyes: Denied eye symptoms, eye pain, photophobia, vision change and visual disturbance.  Ears/Nose/Throat/Neck: Denied ear, nose, throat or neck symptoms, hearing loss, nasal discharge, sinus congestion and sore throat.  Cardiovascular: Denied cardiovascular symptoms, arrhythmia, chest pain/pressure, edema, exercise intolerance, orthopnea and palpitations.  Respiratory: Denied pulmonary symptoms, asthma, pleuritic pain, productive sputum, cough, dyspnea and wheezing.  Gastrointestinal: +nausea, reflux, vomiting, constipation  Genitourinary: Denied genitourinary symptoms including symptomatic vaginal discharge, pelvic relaxation issues, and urinary complaints.  Musculoskeletal: Denied musculoskeletal symptoms, stiffness, swelling, muscle weakness and myalgia.  Dermatologic: Denied dermatology symptoms, rash and scar.  Neurologic: Denied neurology symptoms, dizziness, headache, neck pain and syncope.  Psychiatric: Denied psychiatric symptoms, anxiety and depression.  Endocrine: Denied endocrine symptoms including hot flashes and night sweats.     OBJECTIVE: Initial Physical Exam (New OB)  GENERAL APPEARANCE: alert, well appearing HEAD: normocephalic, atraumatic MOUTH: mucous membranes moist,  pharynx normal without lesions THYROID: enlarged BREASTS: no masses noted, no significant tenderness, no palpable axillary nodes, no skin changes LUNGS: clear to auscultation, no wheezes, rales or rhonchi, symmetric air entry HEART: regular rate and rhythm, no murmurs ABDOMEN: soft, nontender, nondistended, no abnormal masses, no epigastric pain, FHT present, and significant diastasis recti EXTREMITIES: no redness or tenderness in the calves or thighs SKIN: normal coloration and turgor, no rashes LYMPH NODES: no adenopathy palpable NEUROLOGIC: alert, oriented, normal speech, no focal findings or movement disorder noted  PELVIC EXAM EXTERNAL GENITALIA: normal appearing vulva with no masses, tenderness or lesions VAGINA: no abnormal discharge or lesions CERVIX: no lesions or cervical motion tenderness and Pap collected UTERUS: gravid and consistent with 12 weeks  ASSESSMENT: Normal pregnancy [redacted]w[redacted]d   PLAN: Routine prenatal care. We discussed an overview of prenatal care and when to call. Reviewed diet, exercise, and weight gain recommendations in pregnancy. Discussed benefits of breastfeeding and lactation resources at [facility]. Referral placed  to pelvic PT. NOB labs and TSH ordered. Discussed adding protein shakes and trying small, frequent meals and Prilosec to reduce reflux.  See orders  Guadlupe Spanish, CNM

## 2023-09-18 LAB — CBC/D/PLT+RPR+RH+ABO+RUBIGG...
Antibody Screen: NEGATIVE
Basophils Absolute: 0 10*3/uL (ref 0.0–0.2)
Basos: 0 %
EOS (ABSOLUTE): 0 10*3/uL (ref 0.0–0.4)
Eos: 0 %
HCV Ab: NONREACTIVE
HIV Screen 4th Generation wRfx: NONREACTIVE
Hematocrit: 39.6 % (ref 34.0–46.6)
Hemoglobin: 12.6 g/dL (ref 11.1–15.9)
Hepatitis B Surface Ag: NEGATIVE
Immature Grans (Abs): 0 10*3/uL (ref 0.0–0.1)
Immature Granulocytes: 0 %
Lymphocytes Absolute: 0.9 10*3/uL (ref 0.7–3.1)
Lymphs: 22 %
MCH: 28.3 pg (ref 26.6–33.0)
MCHC: 31.8 g/dL (ref 31.5–35.7)
MCV: 89 fL (ref 79–97)
Monocytes Absolute: 0.4 10*3/uL (ref 0.1–0.9)
Monocytes: 9 %
Neutrophils Absolute: 2.9 10*3/uL (ref 1.4–7.0)
Neutrophils: 69 %
Platelets: 199 10*3/uL (ref 150–450)
RBC: 4.45 x10E6/uL (ref 3.77–5.28)
RDW: 13.4 % (ref 11.7–15.4)
RPR Ser Ql: NONREACTIVE
Rh Factor: POSITIVE
Rubella Antibodies, IGG: 1.64 index (ref >0.99)
Varicella zoster IgG: REACTIVE
WBC: 4.2 10*3/uL (ref 3.4–10.8)

## 2023-09-18 LAB — TSH: TSH: 0.598 u[IU]/mL (ref 0.450–4.500)

## 2023-09-18 LAB — HCV INTERPRETATION

## 2023-09-19 ENCOUNTER — Encounter: Payer: Self-pay | Admitting: Obstetrics

## 2023-09-19 LAB — MATERNIT 21 PLUS CORE, BLOOD
Fetal Fraction: 24
Result (T21): NEGATIVE
Trisomy 13 (Patau syndrome): NEGATIVE
Trisomy 18 (Edwards syndrome): NEGATIVE
Trisomy 21 (Down syndrome): NEGATIVE

## 2023-09-22 ENCOUNTER — Encounter: Payer: Self-pay | Admitting: Obstetrics

## 2023-09-22 ENCOUNTER — Ambulatory Visit: Payer: Medicaid Other | Admitting: Obstetrics

## 2023-09-22 VITALS — BP 132/71 | HR 90 | Wt 109.6 lb

## 2023-09-22 DIAGNOSIS — R350 Frequency of micturition: Secondary | ICD-10-CM | POA: Diagnosis not present

## 2023-09-22 DIAGNOSIS — Z3480 Encounter for supervision of other normal pregnancy, unspecified trimester: Secondary | ICD-10-CM

## 2023-09-22 DIAGNOSIS — R300 Dysuria: Secondary | ICD-10-CM

## 2023-09-22 DIAGNOSIS — R82998 Other abnormal findings in urine: Secondary | ICD-10-CM

## 2023-09-22 DIAGNOSIS — R399 Unspecified symptoms and signs involving the genitourinary system: Secondary | ICD-10-CM

## 2023-09-22 LAB — CYTOLOGY - PAP
Chlamydia: NEGATIVE
Comment: NEGATIVE
Comment: NEGATIVE
Comment: NORMAL
Diagnosis: NEGATIVE
Neisseria Gonorrhea: NEGATIVE
Trichomonas: NEGATIVE

## 2023-09-22 LAB — POCT URINALYSIS DIPSTICK OB
Bilirubin, UA: NEGATIVE
Glucose, UA: NEGATIVE
Ketones, UA: NEGATIVE
Nitrite, UA: NEGATIVE
POC,PROTEIN,UA: NEGATIVE
Spec Grav, UA: 1.01 (ref 1.010–1.025)
Urobilinogen, UA: 0.2 U/dL
pH, UA: 7 (ref 5.0–8.0)

## 2023-09-22 MED ORDER — NITROFURANTOIN MONOHYD MACRO 100 MG PO CAPS
100.0000 mg | ORAL_CAPSULE | Freq: Two times a day (BID) | ORAL | 0 refills | Status: DC
Start: 2023-09-22 — End: 2023-10-13

## 2023-09-22 MED ORDER — PRENATAL PLUS VITAMIN/MINERAL 27-1 MG PO TABS: 1.0000 | ORAL_TABLET | Freq: Every day | ORAL | 11 refills | Status: DC

## 2023-09-22 NOTE — Progress Notes (Signed)
    ACUTE VISIT NOTE  Subjective:    Patient ID: Latoya Howell, female    DOB: 09-Oct-1996, 27 y.o.   MRN: [id number]       HPI  Patient is a 26 y.o. [id number] female who presents for UTI symptoms and is currently [redacted]w[redacted]d EGA. Symptoms started 09/20/23 and include urinary frequency and dysuria. Denies hematuria, urgency, and flank pain.   Pt also requesting Rx for prenatal vitamins.   Objective:    BP 132/71   Pulse 90   Wt 109 lb 9.6 oz (49.7 kg)   LMP 07/02/2023 (Exact Date)   BMI 20.71 kg/m    Lab Review  Results for orders placed or performed in visit on 09/22/23  POC Urinalysis Dipstick OB  Result Value Ref Range   Color, UA yellow    Clarity, UA clear    Glucose, UA Negative Negative   Bilirubin, UA neg    Ketones, UA neg    Spec Grav, UA 1.010 1.010 - 1.025   Blood, UA trace    pH, UA 7.0 5.0 - 8.0   POC,PROTEIN,UA Negative Negative, Trace, Small (1+), Moderate (2+), Large (3+), 4+   Urobilinogen, UA 0.2 0.2 or 1.0 E.U./dL   Nitrite, UA neg    Leukocytes, UA Moderate (2+) (A) Negative   Appearance     Odor      Assessment:   1. UTI symptoms   2. Urinary frequency   3. Dysuria   4. Supervision of other normal pregnancy, antepartum   5. Leukocytes in urine      Plan:   UA in office with trace BLD and +2 LE. Urine Culture Sent. Treatment  Macrobid 100 mg PO BID for 7 days. Maintain adequate hydration.  Follow up if symptoms worsen or fail to improve as anticipated, and as needed.  Return to clinic for next OB visit as scheduled.   Julieanne Manson, DO [facility]

## 2023-09-24 LAB — URINE CULTURE

## 2023-10-13 ENCOUNTER — Encounter: Payer: Self-pay | Admitting: Obstetrics & Gynecology

## 2023-10-13 ENCOUNTER — Ambulatory Visit (INDEPENDENT_AMBULATORY_CARE_PROVIDER_SITE_OTHER): Payer: Medicaid Other | Admitting: Obstetrics & Gynecology

## 2023-10-13 VITALS — BP 138/72 | HR 92 | Wt 114.6 lb

## 2023-10-13 DIAGNOSIS — Z3480 Encounter for supervision of other normal pregnancy, unspecified trimester: Secondary | ICD-10-CM

## 2023-10-13 DIAGNOSIS — Z3689 Encounter for other specified antenatal screening: Secondary | ICD-10-CM

## 2023-10-13 DIAGNOSIS — Z3A16 16 weeks gestation of pregnancy: Secondary | ICD-10-CM

## 2023-10-13 NOTE — Progress Notes (Addendum)
   PRENATAL VISIT NOTE  Subjective:  Latoya Howell is a 27 y.o. [id number] at [redacted]w[redacted]d being seen today for ongoing prenatal care.  She is currently monitored for the following issues for this low-risk pregnancy and has History of preterm delivery, currently pregnant; Supervision of normal pregnancy; and Supervision of other normal pregnancy, antepartum on their problem list.  Patient reports no complaints.   . Vag. Bleeding: None.  Movement: Absent. Denies leaking of fluid.   The following portions of the patient's history were reviewed and updated as appropriate: allergies, current medications, past family history, past medical history, past social history, past surgical history and problem list.   Objective:   Vitals:   10/13/23 1019  BP: 138/72  Pulse: 92  Weight: 114 lb 9.6 oz (52 kg)    FHR- 150s with audible movement Rectus diasthesis noted.  Fetal Status:     Movement: Absent     General:  Alert, oriented and cooperative. Patient is in no acute distress.  Skin: Skin is warm and dry. No rash noted.   Cardiovascular: Normal heart rate noted  Respiratory: Normal respiratory effort, no problems with respiration noted  Abdomen: Soft, gravid, appropriate for gestational age.  Pain/Pressure: Absent     Pelvic: Cervical exam deferred        Extremities: Normal range of motion.     Mental Status: Normal mood and affect. Normal behavior. Normal judgment and thought content.   Assessment and Plan:  Pregnancy: [id number] at [redacted]w[redacted]d 1. Supervision of other normal pregnancy, antepartum  - US OB Comp + 14 Wk; Future  2. [redacted] weeks gestation of pregnancy  - US OB Comp + 14 Wk; Future  3. Encounter for fetal anatomic survey  - US OB Comp + 14 Wk; Future  Preterm labor symptoms and general obstetric precautions including but not limited to vaginal bleeding, contractions, leaking of fluid and fetal movement were reviewed in detail with the patient. Please refer to After Visit Summary for other  counseling recommendations.   No follow-ups on file.  Future Appointments  Date Time Provider Department Center  12/31/2023  8:45 AM Mariane Masters, PT [facility] None  01/07/2024  8:45 AM Mariane Masters, PT [facility] None  01/14/2024  8:45 AM Mariane Masters, PT [facility] None  01/21/2024  8:45 AM Mariane Masters, PT [facility] None  01/28/2024  8:45 AM Mariane Masters, PT [facility] None  02/04/2024  8:45 AM Mariane Masters, PT [facility] None  02/11/2024  8:45 AM Mariane Masters, PT [facility] None  02/18/2024  8:45 AM Mariane Masters, PT [facility] None  02/25/2024  8:45 AM Mariane Masters, PT [facility] None  03/03/2024  8:45 AM Mariane Masters, PT [facility] None    Allie Bossier, MD

## 2023-11-11 ENCOUNTER — Ambulatory Visit (INDEPENDENT_AMBULATORY_CARE_PROVIDER_SITE_OTHER): Payer: Medicaid Other | Admitting: Obstetrics & Gynecology

## 2023-11-11 ENCOUNTER — Ambulatory Visit: Payer: Medicaid Other

## 2023-11-11 VITALS — BP 112/64 | HR 76 | Wt 121.0 lb

## 2023-11-11 DIAGNOSIS — Z3A16 16 weeks gestation of pregnancy: Secondary | ICD-10-CM | POA: Diagnosis not present

## 2023-11-11 DIAGNOSIS — Z3689 Encounter for other specified antenatal screening: Secondary | ICD-10-CM

## 2023-11-11 DIAGNOSIS — Z3480 Encounter for supervision of other normal pregnancy, unspecified trimester: Secondary | ICD-10-CM

## 2023-11-11 DIAGNOSIS — Z3482 Encounter for supervision of other normal pregnancy, second trimester: Secondary | ICD-10-CM | POA: Diagnosis not present

## 2023-11-11 DIAGNOSIS — Z3A20 20 weeks gestation of pregnancy: Secondary | ICD-10-CM

## 2023-11-11 DIAGNOSIS — O09899 Supervision of other high risk pregnancies, unspecified trimester: Secondary | ICD-10-CM

## 2023-11-11 NOTE — Progress Notes (Signed)
   PRENATAL VISIT NOTE  Subjective:  Latoya Howell is a 27 y.o. [id number] at [redacted]w[redacted]d being seen today for ongoing prenatal care.  She is currently monitored for the following issues for this low-risk pregnancy and has History of preterm delivery, currently pregnant; Supervision of normal pregnancy; and Supervision of other normal pregnancy, antepartum on their problem list.  Patient reports no complaints.  Contractions: Not present. Vag. Bleeding: None.  Movement: Present. Denies leaking of fluid.   The following portions of the patient's history were reviewed and updated as appropriate: allergies, current medications, past family history, past medical history, past social history, past surgical history and problem list.   Objective:   Vitals:   11/11/23 0926  BP: 112/64  Pulse: 76  Weight: 121 lb (54.9 kg)    Fetal Status:     Movement: Present    FHR- 140s FH- at umbilicus  General:  Alert, oriented and cooperative. Patient is in no acute distress.  Skin: Skin is warm and dry. No rash noted.   Cardiovascular: Normal heart rate noted  Respiratory: Normal respiratory effort, no problems with respiration noted  Abdomen: Soft, gravid, appropriate for gestational age.  Pain/Pressure: Present     Pelvic: Cervical exam deferred        Extremities: Normal range of motion.     Mental Status: Normal mood and affect. Normal behavior. Normal judgment and thought content.   Assessment and Plan:  Pregnancy: [id number] at [redacted]w[redacted]d 1. Supervision of other normal pregnancy, antepartum   2. History of preterm delivery, currently pregnant - first baby around 36 weeks and second baby around 37 weeks at delivery  3. [redacted] weeks gestation of pregnancy - She declines flu vaccine today  Preterm labor symptoms and general obstetric precautions including but not limited to vaginal bleeding, contractions, leaking of fluid and fetal movement were reviewed in detail with the patient. Please refer to After Visit  Summary for other counseling recommendations.   Return in about 4 weeks (around 12/09/2023).  Future Appointments  Date Time Provider Department Center  12/31/2023  9:30 AM Mariane Masters, PT [facility] None  01/07/2024  9:30 AM Mariane Masters, PT [facility] None  01/14/2024  9:30 AM Mariane Masters, PT [facility] None  01/21/2024  9:30 AM Mariane Masters, PT [facility] None  01/28/2024  9:30 AM Mariane Masters, PT [facility] None  02/04/2024  9:30 AM Mariane Masters, PT [facility] None  02/11/2024  8:45 AM Mariane Masters, PT [facility] None  02/18/2024  8:45 AM Mariane Masters, PT [facility] None  02/25/2024  8:45 AM Mariane Masters, PT [facility] None  03/03/2024  8:45 AM Mariane Masters, PT [facility] None    Allie Bossier, MD

## 2023-12-09 ENCOUNTER — Ambulatory Visit (INDEPENDENT_AMBULATORY_CARE_PROVIDER_SITE_OTHER): Payer: Medicaid Other | Admitting: Obstetrics

## 2023-12-09 ENCOUNTER — Encounter: Payer: Self-pay | Admitting: Obstetrics

## 2023-12-09 VITALS — BP 120/70 | HR 67 | Wt 135.0 lb

## 2023-12-09 DIAGNOSIS — Z3480 Encounter for supervision of other normal pregnancy, unspecified trimester: Secondary | ICD-10-CM

## 2023-12-09 NOTE — Progress Notes (Signed)
    Return Prenatal Note   Subjective  27 y.o. [id number] at [redacted]w[redacted]d presents for this follow-up prenatal visit. Pregnancy notable for hx of PTD x 1 at 36wks with subsequent 37wk delivery.   Patient feels like she is gaining a lot of weight, does eat a lot all the time and always feels hungry. Also feels a little nauseous, but states this happens when her baby is very active, and he has been really moving this morning. No vomiting and doesn't have other GI symptoms indicating illness. Nicotine screen and UDS not done during her NOB labs.   Patient reports: Movement: Present Contractions: Not present Denies vaginal bleeding or leaking fluid. Objective  Flow sheet Vitals: Pulse Rate: 67 BP: 120/70 Fundal Height: 24 cm Fetal Heart Rate (bpm): 140 Total weight gain: 25 lb (11.3 kg)  General Appearance  No acute distress, well appearing, and well nourished Pulmonary   Normal work of breathing Neurologic   Alert and oriented to person, place, and time Psychiatric   Mood and affect within normal limits  Assessment/Plan   Plan  27 y.o. [id number] at [redacted]w[redacted]d presents for follow-up OB visit. Reviewed prenatal record including previous visit note. 1. Supervision of other normal pregnancy, antepartum (Primary) -Discussed 28wk labs and glucola instructions for next visit, ordered.  -Declines Flu vaccine today -Collected urine for UDS and nicotine screen -Reassurance about weight gain, recommend 25-40lbs total  Return in about 4 weeks (around 01/06/2024) for ROB w/1hGTT, 28wk labs.   Future Appointments  Date Time Provider Department Center  12/31/2023  9:30 AM Mariane Masters, PT [facility] None  01/06/2024  8:20 AM [facility] None  01/06/2024  8:55 AM Dominic, Courtney Heys, CNM [facility] None  01/07/2024  9:30 AM Mariane Masters, PT [facility] None  01/14/2024  9:30 AM Mariane Masters, PT [facility] None  01/21/2024  9:30 AM Mariane Masters, PT [facility] None  01/28/2024  9:30 AM Mariane Masters, PT [facility] None  02/04/2024  9:30 AM Mariane Masters, PT [facility] None  02/11/2024  8:45 AM Mariane Masters, PT [facility] None  02/18/2024  8:45 AM Mariane Masters, PT [facility] None  02/25/2024  8:45 AM Mariane Masters, PT [facility] None  03/03/2024  8:45 AM Mariane Masters, PT [facility] None   For next visit:  ROB with 1 hour gluocla, third trimester labs, and Tdap   Julieanne Manson, DO [facility]

## 2023-12-15 LAB — MONITOR DRUG PROFILE 14(MW)
Amphetamine Scrn, Ur: NEGATIVE ng/mL
BARBITURATE SCREEN URINE: NEGATIVE ng/mL
BENZODIAZEPINE SCREEN, URINE: NEGATIVE ng/mL
Buprenorphine, Urine: NEGATIVE ng/mL
Cocaine (Metab) Scrn, Ur: NEGATIVE ng/mL
Creatinine(Crt), U: 125.5 mg/dL (ref 20.0–300.0)
Fentanyl, Urine: NEGATIVE pg/mL
Meperidine Screen, Urine: NEGATIVE ng/mL
Methadone Screen, Urine: NEGATIVE ng/mL
OXYCODONE+OXYMORPHONE UR QL SCN: NEGATIVE ng/mL
Opiate Scrn, Ur: NEGATIVE ng/mL
Ph of Urine: 6.6 (ref 4.5–8.9)
Phencyclidine Qn, Ur: NEGATIVE ng/mL
Propoxyphene Scrn, Ur: NEGATIVE ng/mL
SPECIFIC GRAVITY: 1.023
Tramadol Screen, Urine: NEGATIVE ng/mL

## 2023-12-15 LAB — CANNABINOID (GC/MS), URINE
Cannabinoid: POSITIVE — AB
Carboxy THC (GC/MS): 91 ng/mL

## 2023-12-15 LAB — NICOTINE SCREEN, URINE: Cotinine Ql Scrn, Ur: NEGATIVE ng/mL

## 2023-12-31 ENCOUNTER — Encounter: Payer: Self-pay | Admitting: Physical Therapy

## 2023-12-31 ENCOUNTER — Ambulatory Visit: Payer: Medicaid Other | Attending: Obstetrics | Admitting: Physical Therapy

## 2023-12-31 DIAGNOSIS — R278 Other lack of coordination: Secondary | ICD-10-CM | POA: Diagnosis present

## 2023-12-31 DIAGNOSIS — M6208 Separation of muscle (nontraumatic), other site: Secondary | ICD-10-CM | POA: Insufficient documentation

## 2023-12-31 DIAGNOSIS — R2689 Other abnormalities of gait and mobility: Secondary | ICD-10-CM | POA: Insufficient documentation

## 2023-12-31 DIAGNOSIS — M533 Sacrococcygeal disorders, not elsewhere classified: Secondary | ICD-10-CM | POA: Diagnosis present

## 2023-12-31 NOTE — Patient Instructions (Signed)
  Proper body mechanics with getting out of a chair to decrease strain  on back &pelvic floor   Avoid holding your breath when Getting out of the chair:  Scoot to front part of chair chair Heels behind knees, feet are hip width apart, nose over toes  Inhale like you are smelling roses Exhale to stand   __  [place]isquat for bending and picking up toys  Scoot buttocks back slight, hinge like you are looking at your reflection on a pond  Knees behind toes,  Inhale to smell flowers  Exhale on the rise like rocket  Do not lock knees, have more weight across ballmounds of feet, toes relaxed and spread them, not grip them   10 reps x 3 x day   __ Wiping counters -- do not bend forward  Stand 45 deg to the counter, leg closest to counter is the leg that is back,   __   Lengthen Back rib by L  shoulder ( winging)    Lie on R  side , pillow between knees and under head  Pull  L arm overhead over mattress, grab the edge of mattress,pull it upward, drawing elbow away from ears  Breathing 10 reps  Brushing arm with 3/4 turn onto pillow behind back  Lying on R  side ,Pillow/ Block between knees     dragging top forearm across ribs below breast rotating 3/4 turn,  rotating  _L_ only this week ,  relax onto the pillow behind the back  and then back to other palm , maintain top palm on body whole top and not lift shoulder  Do this side this week       Wait do both sides until we have levelled out your spine and shoulders ___

## 2023-12-31 NOTE — Therapy (Signed)
 OUTPATIENT PHYSICAL THERAPY EVALUATION   Patient Name: Latoya Howell MRN: [id number] DOB:06-12-1996, 28 y.o., female Today's Date: 12/31/2023   PT End of Session - 12/31/23 0951     Visit Number 1    Number of Visits 10    Date for PT Re-Evaluation 03/10/24    PT Start Time 0946    PT Stop Time 1015    PT Time Calculation (min) 29 min    Activity Tolerance Patient tolerated treatment well;No increased pain    Behavior During Therapy WFL for tasks assessed/performed             Past Medical History:  Diagnosis Date   Anemia    History of premature delivery    at 36 wks 2 d   Past Surgical History:  Procedure Laterality Date   denies surgical hx     Patient Active Problem List   Diagnosis Date Noted   Supervision of other normal pregnancy, antepartum 08/22/2023   History of preterm delivery, currently pregnant 08/18/2023   Supervision of normal pregnancy 08/18/2023    PCP:   REFERRING PROVIDER: Justino Setter, CNM   REFERRING DIAG:  diastasis recti   Rationale for Evaluation and Treatment Rehabilitation  THERAPY DIAG:  Other abnormalities of gait and mobility  Other lack of coordination  Sacrococcygeal disorders, not elsewhere classified  ONSET DATE:   SUBJECTIVE:                                                                                                                                                                                           SUBJECTIVE STATEMENT:    Pt is [redacted] weeks pregnant with 3rd child and experiences the following Sx 1)  L groin pain which occurs with  7/10 pain  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   Denied radiating down leg.  Numbness on L leg when lying on L side   2) Central low back pain  8-9/10  and L midback pain 10/10 with L side without radiating pain at either areas. Limits her from lifting and she has to sit a certain way and she has to lean over. 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 Yes: see above   PRECAUTIONS: pregnancy precautions   WEIGHT BEARING RESTRICTIONS: Yes   FALLS:  Has  patient fallen in last 6 months? No   LIVING ENVIRONMENT: Lives with: partner  Lives in: trailers  Stairs: STE with rail   OCCUPATION: Therapist, Nutritional   PLOF: IND   PATIENT GOALS:  Get through pregnancy without issues    OBJECTIVE:   [facility] PT Assessment - 12/31/23 0954       Observation/Other Assessments   Observations ankles crossed      Strength   Overall Strength Comments B knee flexin 3/5      Palpation   SI assessment  L shoulder and iliac crest lowered    Palpation comment tightness along T/L junction, medial scapula L, hypomobile L SIJ      Ambulation/Gait   Gait Comments 1.09 m/s, miminal armswing, minimal anterior rotation of L hip, more genu valgus of L knee than R             [facility] Adult PT Treatment/Exercise - 12/31/23 0954       Therapeutic Activites    Therapeutic Activities Other Therapeutic Activities    Other Therapeutic Activities discussedbody mechanics with bending and wiping counters at workout ith more pelvic girdle stability,      Neuro Re-ed    Neuro Re-ed Details  cued for L sided T/L stretch and cued for body mecahnics for sqquats to bend, sitting      Manual Therapy   Manual therapy comments STM/MWM  at L T/L junciton to realign spine/ pelvis               HOME EXERCISE PROGRAM: See pt instruction section    ASSESSMENT:  CLINICAL IMPRESSION: Pt is 28 years old and in the 27th week of her 3rd pregnancy. 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   Denied radiating down leg.    2)  Central low back pain  Pt's musculoskeletal assessment revealed uneven pelvic girdle and shoulder height, asymmetries to gait pattern, limited spinal /pelvic mobility, dyscoordination and strength of pelvic floor mm, hip weakness, poor body mechanics which places strain on the abdominal/pelvic floor mm. These are deficits that indicate an ineffective intraabdominal pressure system associated with increased risk for pt's Sx.   Pt was provided education on etiology of Sx with anatomy, physiology explanation with images along with the benefits of customized pelvic PT Tx based on pt's medical conditions and musculoskeletal deficits.  Explained the physiology of deep core mm coordination and roles of pelvic floor function in urination, defecation, sexual function, and postural control with deep core mm system.  Regional interdependent approaches will yield greater benefits in pt's POC.   Following Tx today which pt tolerated without complaints,  pt demo'd proper body mechanics to minimize straining pelvic floor/  back at home and at work.   Pt also showed levelled spine/ pelvis  following manual Tx and pt reported no L groin and improved gait. However, pt will need more strengthening customized HEP to maintain this alignment and learn more body mechanics to minimize pain during pregnancy and beyond given this is her 3rd pregnancy and she is on her feet all day as a Therapist, Nutritional.   Pt benefits from skilled PT  to help minimize pain and promote optimize IAP system for improved pelvic floor function, trunk stability, gait, balance, stabilization with mobility tasks.      OBJECTIVE  IMPAIRMENTS decreased activity tolerance, decreased coordination, decreased endurance, decreased mobility, difficulty walking, decreased ROM, decreased strength, decreased safety awareness, hypomobility, increased muscle spasms, impaired flexibility, improper body mechanics, postural dysfunction, and pain. scar restrictions    ACTIVITY LIMITATIONS  self-care,  sleep, home chores, work tasks    PARTICIPATION LIMITATIONS:  community, work  activities    PERSONAL FACTORS       are also affecting patient's functional outcome.    REHAB POTENTIAL: Good   CLINICAL DECISION MAKING: Evolving/moderate complexity   EVALUATION COMPLEXITY: Moderate    PATIENT EDUCATION:    Education details: Showed pt anatomy images. Explained muscles attachments/ connection, physiology of deep core system/ spinal- thoracic-pelvis-lower kinetic chain as they relate to pt's presentation, Sx, and past Hx. Explained what and how these areas of deficits need to be restored to balance and function    See Therapeutic activity / neuromuscular re-education section  Answered pt's questions.   Person educated: Patient Education method: Explanation, Demonstration, Tactile cues, Verbal cues, and Handouts Education comprehension: verbalized understanding, returned demonstration, verbal cues required, tactile cues required, and needs further education     PLAN: PT FREQUENCY: 1x/week   PT DURATION: 10 weeks   PLANNED INTERVENTIONS: Therapeutic exercises, Therapeutic activity, Neuromuscular re-education, Balance training, Gait training, Patient/Family education, Self Care, Joint mobilization, Spinal mobilization, Moist heat, Taping, and Manual therapy, dry needling.   PLAN FOR NEXT SESSION: See clinical impression for plan     GOALS: Goals reviewed with patient? Yes  SHORT TERM GOALS: Target date: .wplus4  1. Pt will demo IND with HEP                    Baseline: Not IND            Goal status: INITIAL   LONG TERM GOALS: Target date: 03/10/2024    1.Pt will demo proper deep core coordination without chest breathing and optimal excursion of diaphragm/pelvic floor in order to promote spinal stability and pelvic floor function  Baseline: dyscoordination Goal status: INITIAL  2.  Pt will demo > 5 pt change on FOTO  to improve QOL  and function    Pelvic Pain baseline - 71 Lower score = better function     Lumber baseline  - 30 Higher score = better function  Goal status: INITIAL   3.  Pt will demo proper body mechanics in against gravity tasks and ADLs  work tasks, fitness  to minimize straining pelvic floor / back    Baseline: not IND, improper form that places strain on pelvic floor  Goal status: INITIAL    4. Pt will demo increased gait speed > 1.3 m/s with reciprocal gait pattern, longer stride length  in order to ambulate safely in community and return to fitness routine  Baseline: 1.09 m/s , mininal armswing, limited anterior rotation of L pelvis, L genu valgus  Goal status: INITIAL    5. 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 Pt will report no numbness on LLE during > 1 night and sleeping through the night  Baseline: Numbness on L leg when lying on L side , difficult to sleep Goal status: INITIAL     Pia Lupe Plump, PT 12/31/2023, 9:55 AM

## 2024-01-06 ENCOUNTER — Encounter: Payer: Self-pay | Admitting: Certified Nurse Midwife

## 2024-01-06 ENCOUNTER — Other Ambulatory Visit: Payer: Medicaid Other

## 2024-01-06 ENCOUNTER — Ambulatory Visit (INDEPENDENT_AMBULATORY_CARE_PROVIDER_SITE_OTHER): Payer: Medicaid Other | Admitting: Certified Nurse Midwife

## 2024-01-06 ENCOUNTER — Encounter: Payer: Self-pay | Admitting: Licensed Practical Nurse

## 2024-01-06 VITALS — BP 137/69 | HR 93 | Wt 140.1 lb

## 2024-01-06 DIAGNOSIS — Z23 Encounter for immunization: Secondary | ICD-10-CM

## 2024-01-06 DIAGNOSIS — O09899 Supervision of other high risk pregnancies, unspecified trimester: Secondary | ICD-10-CM

## 2024-01-06 DIAGNOSIS — Z3A28 28 weeks gestation of pregnancy: Secondary | ICD-10-CM

## 2024-01-06 DIAGNOSIS — Z3480 Encounter for supervision of other normal pregnancy, unspecified trimester: Secondary | ICD-10-CM

## 2024-01-06 DIAGNOSIS — Z131 Encounter for screening for diabetes mellitus: Secondary | ICD-10-CM

## 2024-01-06 DIAGNOSIS — Z3483 Encounter for supervision of other normal pregnancy, third trimester: Secondary | ICD-10-CM

## 2024-01-06 NOTE — Patient Instructions (Signed)

## 2024-01-06 NOTE — Assessment & Plan Note (Addendum)
 Reviewed kick counts and preterm labor warning signs. Instructed to call office or come to hospital with persistent headache, vision changes, regular contractions, leaking of fluid, decreased fetal movement or vaginal bleeding. 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

## 2024-01-06 NOTE — Progress Notes (Signed)
    Return Prenatal Note   Subjective   28 y.o. [id number] at [redacted]w[redacted]d presents for this follow-up prenatal visit.  Patient feeling well, saw PT and feels it was helpful. Having back pain & hip pain. Trying to eat less sugar. Gained ~40-50lb in last pregnancy. Patient reports: Movement: Absent Contractions: Not present  Objective   Flow sheet Vitals: Pulse Rate: 93 BP: 137/69 Fundal Height: 28 cm Fetal Heart Rate (bpm): 140 Total weight gain: 30 lb 1.6 oz (13.7 kg)  General Appearance  No acute distress, well appearing, and well nourished Pulmonary   Normal work of breathing Neurologic   Alert and oriented to person, place, and time Psychiatric   Mood and affect within normal limits  Assessment/Plan   Plan  28 y.o. [id number] at [redacted]w[redacted]d presents for follow-up OB visit. Reviewed prenatal record including previous visit note.  Supervision of other normal pregnancy, antepartum Reviewed kick counts and preterm labor warning signs. Instructed to call office or come to hospital with persistent headache, vision changes, regular contractions, leaking of fluid, decreased fetal movement or vaginal bleeding. 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      Orders Placed This Encounter  Procedures   Tdap vaccine greater than or equal to 7yo IM   Return in 2 weeks (on 01/20/2024) for ROB.   Future Appointments  Date Time Provider Department Center  01/07/2024  9:30 AM Pia Fiscal, PT [facility] None  01/14/2024  9:30 AM Pia Fiscal, PT [facility] None  01/21/2024  9:30 AM Pia Fiscal, PT [facility] None  01/22/2024 10:55 AM Jayne Harlene CROME, CNM [facility] None  01/28/2024  9:30 AM Pia Fiscal, PT [facility] None  02/04/2024  9:30 AM Pia Fiscal, PT [facility] None  02/11/2024  8:45 AM Pia Fiscal, PT [facility] None  02/18/2024  8:45 AM Pia Fiscal, PT [facility] None   02/25/2024  8:45 AM Pia Fiscal, PT [facility] None  03/03/2024  8:45 AM Pia Fiscal, PT [facility] None    For next visit:  continue with routine prenatal care     Harlene CROME Jayne, CNM  01/14/251:35 PM

## 2024-01-07 ENCOUNTER — Ambulatory Visit: Payer: Medicaid Other | Admitting: Physical Therapy

## 2024-01-07 ENCOUNTER — Other Ambulatory Visit: Payer: Self-pay | Admitting: Obstetrics

## 2024-01-07 ENCOUNTER — Telehealth: Payer: Self-pay | Admitting: Obstetrics

## 2024-01-07 VITALS — BP 117/68 | HR 89

## 2024-01-07 DIAGNOSIS — R2689 Other abnormalities of gait and mobility: Secondary | ICD-10-CM

## 2024-01-07 DIAGNOSIS — O99013 Anemia complicating pregnancy, third trimester: Secondary | ICD-10-CM | POA: Insufficient documentation

## 2024-01-07 DIAGNOSIS — M533 Sacrococcygeal disorders, not elsewhere classified: Secondary | ICD-10-CM

## 2024-01-07 DIAGNOSIS — R278 Other lack of coordination: Secondary | ICD-10-CM

## 2024-01-07 LAB — 28 WEEK RH+PANEL
Basophils Absolute: 0 10*3/uL (ref 0.0–0.2)
Basos: 0 %
EOS (ABSOLUTE): 0 10*3/uL (ref 0.0–0.4)
Eos: 1 %
Gestational Diabetes Screen: 124 mg/dL (ref 70–139)
HIV Screen 4th Generation wRfx: NONREACTIVE
Hematocrit: 31.9 % — ABNORMAL LOW (ref 34.0–46.6)
Hemoglobin: 10.6 g/dL — ABNORMAL LOW (ref 11.1–15.9)
Immature Grans (Abs): 0.2 10*3/uL — ABNORMAL HIGH (ref 0.0–0.1)
Immature Granulocytes: 3 %
Lymphocytes Absolute: 0.8 10*3/uL (ref 0.7–3.1)
Lymphs: 14 %
MCH: 29.9 pg (ref 26.6–33.0)
MCHC: 33.2 g/dL (ref 31.5–35.7)
MCV: 90 fL (ref 79–97)
Monocytes Absolute: 0.4 10*3/uL (ref 0.1–0.9)
Monocytes: 6 %
Neutrophils Absolute: 4.5 10*3/uL (ref 1.4–7.0)
Neutrophils: 76 %
Platelets: 140 10*3/uL — ABNORMAL LOW (ref 150–450)
RBC: 3.55 x10E6/uL — ABNORMAL LOW (ref 3.77–5.28)
RDW: 12.6 % (ref 11.7–15.4)
RPR Ser Ql: NONREACTIVE
WBC: 5.9 10*3/uL (ref 3.4–10.8)

## 2024-01-07 MED ORDER — DOCUSATE SODIUM 100 MG PO CAPS
100.0000 mg | ORAL_CAPSULE | Freq: Every day | ORAL | 0 refills | Status: AC
Start: 2024-01-07 — End: 2025-01-06

## 2024-01-07 MED ORDER — FERROUS SULFATE 325 (65 FE) MG PO TBEC
325.0000 mg | DELAYED_RELEASE_TABLET | Freq: Every day | ORAL | 0 refills | Status: AC
Start: 2024-01-07 — End: 2025-01-06

## 2024-01-07 NOTE — Telephone Encounter (Deleted)
 Call

## 2024-01-07 NOTE — Telephone Encounter (Signed)
 Physical therapy provider called and stated patient was currently in her office for PT with complaints of feeling light head for the last 2 weeks. She checked her blood pressure while being in office, 117/87. Patient is asking for work note to stop working completely, PT provider agrees. They had called our office requesting call/msg sent to Latoya Howell. Advised message would be sent to our on call provider, can you please advise in Latoya Howell's absence? Letter can be sent via mychart.

## 2024-01-07 NOTE — Therapy (Signed)
 OUTPATIENT PHYSICAL THERAPY  TREATMENT    Patient Name: Joua Vanderkolk MRN: [id number] DOB:03-02-1996, 28 y.o., female Today's Date: 01/07/2024   PT End of Session - 01/07/24 0945     Visit Number 2    Number of Visits 10    Date for PT Re-Evaluation 03/10/24    PT Start Time 0940    PT Stop Time 1020    PT Time Calculation (min) 40 min    Activity Tolerance Patient tolerated treatment well;No increased pain    Behavior During Therapy WFL for tasks assessed/performed             Past Medical History:  Diagnosis Date   Anemia    History of premature delivery    at 36 wks 2 d   Past Surgical History:  Procedure Laterality Date   denies surgical hx     Patient Active Problem List   Diagnosis Date Noted   Supervision of other normal pregnancy, antepartum 08/22/2023   History of preterm delivery, currently pregnant 08/18/2023   Supervision of normal pregnancy 08/18/2023    PCP:   REFERRING PROVIDER: Josue Nip, CNM   REFERRING DIAG:  diastasis recti   Rationale for Evaluation and Treatment Rehabilitation  THERAPY DIAG:  Other abnormalities of gait and mobility  Other lack of coordination  Sacrococcygeal disorders, not elsewhere classified  ONSET DATE:   SUBJECTIVE:         SUBJECTIVE  STATEMENT TODAY:  L groin pain is 75% improved since last session. But she woke this morning  with L groin 8/10. And every morning, her L heel hurts.  It thinks it has to do with how she is lying on her side. She has 5 pillows to keep elevated.  Pillow between legs, She thinks a body pillow will work.           At work, she leans on her L leg and holds onto counter when she is getting lightheaded. She saw her OB yesterday and told her about the lightheadedness. Lightheadedness started the past 2 weeks and she also gets hot really fast.  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 Pt plans to ask for a medical order to cut  back her work hours.  When she had similar lightedness Sx when she was in her last trimester with her daughter and had stopped working at 32 weeks.   Pt is at 28 weeks.                                                                                                                                                               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   Denied radiating down leg.  Numbness on L leg when lying on L side   2) Central low back pain  8-9/10  and L midback pain 10/10 with L side without radiating pain at either areas. Limits her from lifting and she has to sit a certain way and she has to lean over. 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 Yes: see above   PRECAUTIONS: pregnancy precautions   WEIGHT BEARING RESTRICTIONS: Yes   FALLS:  Has patient fallen in last 6 months? No   LIVING ENVIRONMENT: Lives with: partner  Lives in: trailers  Stairs: STE with rail   OCCUPATION: Therapist, nutritional   PLOF: IND   PATIENT GOALS:  Get through pregnancy without issues    OBJECTIVE:     [facility] PT Assessment - 01/07/24 1000       Observation/Other Assessments   Observations Pt needed to sit down and asked for water after being shown standing variations with UE support, heel raises to improve circulation.  Monitored BP which was 112/ 87  HR 89 bmp and provided water. Pt brought Frito chips and Naked green drink with her to clinic.    Alignment:L  levelled shoudler, pelvis   Sit to stand:   poor technique , reported pain at pubic area.  No pain after cues for proper technique           [facility] Adult PT Treatment/Exercise - 01/07/24 1104       Therapeutic Activites    Other Therapeutic Activities discussed bed modifications for more comfort, telephoned  OB clinic to advocate for work restriction order due to lightheadedness and pt moving into trimesters, provided SIJ belt and isntructed on wear      Neuro Re-ed    Neuro Re-ed Details  cued for standing variations to promote pelvic girdle stability and to avoid leaning on LLE which is contributing to L heel pain, cued for correcting hyperextended knees,               HOME EXERCISE PROGRAM: See pt instruction section    ASSESSMENT:  CLINICAL IMPRESSION:   Pt reported 70% improved at  L groin after last session.   Pt demo'd  poor technique with sit to stand and reported pain at pubic area.  No pain after cues for proper technique. Provided SIJ belt which will help wtih pelvic girdle stability. Discussed bed modifications for more comfort  Provided lateral trunk and posterior back stretches which will help with DRA.  Cued for standing variations to promote pelvic girdle stability and to avoid leaning on LLE which is contributing to L heel pain, cued for correcting hyperextended knees. Pt leans on LLE with hand on counter to support herself at work because she has been feeling lightheaded lately.   HEP to be done in seated position due to pt's c/o lightedheadness occurring across the past 2 weeks which OB was made aware of at her visit with OB yesterday.   Pt needed to sit down and  asked for water after being shown standing variations with UE support, heel raises to improve circulation.  Monitored BP which was 112/ 87  HR 89 bmp and provided water. Pt brought Frito chips and Naked green drink with her to clinic.   She reported she had similar lightedness Sx when she was in her last trimester with her daughter and had stopped working at 32 weeks.  Pt voiced she would like to stop working as she is at 28 weeks this  week because she does not want to pass out at work.  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  Staff voiced she will communicate with OB for note which will be messaged to pt via My Chart.    Pt benefits from skilled PT  to help pt through her trimesters and  minimize pain / romote optimize IAP system for improved pelvic floor function, trunk stability, gait, balance, stabilization with mobility tasks.      OBJECTIVE IMPAIRMENTS decreased activity tolerance, decreased coordination, decreased endurance, decreased mobility, difficulty walking, decreased ROM, decreased strength, decreased safety awareness, hypomobility, increased muscle spasms, impaired flexibility, improper body mechanics, postural dysfunction, and pain. scar restrictions   ACTIVITY LIMITATIONS  self-care,  sleep, home chores, work tasks    PARTICIPATION LIMITATIONS:  community, work  activities    PERSONAL FACTORS       are also affecting patient's functional outcome.    REHAB POTENTIAL: Good   CLINICAL DECISION MAKING: Evolving/moderate complexity   EVALUATION COMPLEXITY: Moderate    PATIENT EDUCATION:    Education details: Showed pt anatomy images. Explained muscles attachments/ connection, physiology of deep core system/ spinal- thoracic-pelvis-lower kinetic chain as they relate to pt's presentation, Sx, and past Hx. Explained what and how these areas of deficits need to be restored to balance and function    See Therapeutic activity / neuromuscular re-education section  Answered pt's questions.   Person educated: Patient Education method: Explanation, Demonstration, Tactile cues, Verbal cues, and Handouts Education comprehension: verbalized understanding, returned demonstration, verbal cues required, tactile cues required, and needs further education     PLAN: PT  FREQUENCY: 1x/week   PT DURATION: 10 weeks   PLANNED INTERVENTIONS: Therapeutic exercises, Therapeutic activity, Neuromuscular re-education, Balance training, Gait training, Patient/Family education, Self Care, Joint mobilization, Spinal mobilization, Moist heat, Taping, and Manual therapy, dry needling.   PLAN FOR NEXT SESSION: See clinical impression for plan     GOALS: Goals reviewed with patient? Yes  SHORT TERM GOALS: Target date: .wplus4  1. Pt will demo IND with HEP                    Baseline: Not IND            Goal status: INITIAL   LONG TERM GOALS: Target date: 03/10/2024    1.Pt will demo proper deep core coordination without chest breathing and optimal excursion of diaphragm/pelvic floor in order to promote spinal stability and pelvic floor function  Baseline: dyscoordination Goal status: INITIAL  2.  Pt will demo > 5 pt change on FOTO  to improve QOL and function    Pelvic Pain baseline - 71 Lower score = better function     Lumber baseline  - 30 Higher score = better function  Goal status: INITIAL   3.  Pt will demo proper  body mechanics in against gravity tasks and ADLs  work tasks, fitness  to minimize straining pelvic floor / back    Baseline: not IND, improper form that places strain on pelvic floor  Goal status: INITIAL    4. Pt will demo increased gait speed > 1.3 m/s with reciprocal gait pattern, longer stride length  in order to ambulate safely in community and return to fitness routine  Baseline: 1.09 m/s , mininal armswing, limited anterior rotation of L pelvis, L genu valgus  Goal status: INITIAL    5. 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 Pt will report no numbness on LLE during > 1 night and sleeping through the night  Baseline: Numbness on L leg when  lying on L side , difficult to sleep Goal status: INITIAL     Modesto Andreas, PT 01/07/2024, 9:46 AM

## 2024-01-07 NOTE — Patient Instructions (Signed)
 Sleeping modification : Rolled towel with rubber bands to tuck under belly when lying on side   __  Standing posture  Shift navel forward slightly, shift weight across heel and ball mound, unlock knees   At work, variation to standing to offload weight of belly:  Scissor stance , feet hip width apart, 50% weight at each , knees unlocked   -when leaning , weight on both legs not just the L , and do not lock knees, shift center of mass forward over ballmounds  ___    Wear SIJ belt often with movements  __  Seated stretches for back and opening ribs for improving diastasis  - rainbow and high five to sky 10 reps   - one arm hug ball   ___   Proper body mechanics with getting out of a chair to decrease strain  on back &pelvic floor   Avoid holding your breath when Getting out of the chair:  Scoot to front part of chair chair Heels behind knees, feet are hip width apart, nose over toes  Inhale like you are smelling roses Exhale to stand

## 2024-01-08 MED ORDER — PRENATAL PLUS VITAMIN/MINERAL 27-1 MG PO TABS
1.0000 | ORAL_TABLET | Freq: Every day | ORAL | 3 refills | Status: DC
Start: 2024-01-08 — End: 2024-01-22

## 2024-01-08 NOTE — Addendum Note (Signed)
Addended by: Hartley Barefoot on: 01/08/2024 11:58 AM   Modules accepted: Orders

## 2024-01-14 ENCOUNTER — Ambulatory Visit: Payer: Medicaid Other | Admitting: Physical Therapy

## 2024-01-21 ENCOUNTER — Ambulatory Visit: Payer: Medicaid Other | Admitting: Physical Therapy

## 2024-01-21 DIAGNOSIS — R2689 Other abnormalities of gait and mobility: Secondary | ICD-10-CM

## 2024-01-21 DIAGNOSIS — M533 Sacrococcygeal disorders, not elsewhere classified: Secondary | ICD-10-CM

## 2024-01-21 DIAGNOSIS — R278 Other lack of coordination: Secondary | ICD-10-CM

## 2024-01-21 NOTE — Therapy (Signed)
OUTPATIENT PHYSICAL THERAPY  TREATMENT    Patient Name: Latoya Howell MRN: [id number] DOB:1996/11/19, 28 y.o., female Today's Date: 01/21/2024   PT End of Session - 01/21/24 1014     Visit Number 3    Number of Visits 10    Date for PT Re-Evaluation 03/10/24    PT Start Time 0941    PT Stop Time 1015    PT Time Calculation (min) 34 min    Activity Tolerance Patient tolerated treatment well;No increased pain    Behavior During Therapy WFL for tasks assessed/performed             Past Medical History:  Diagnosis Date   Anemia    History of premature delivery    at 36 wks 2 d   Past Surgical History:  Procedure Laterality Date   denies surgical hx     Patient Active Problem List   Diagnosis Date Noted   Anemia affecting pregnancy in third trimester 01/07/2024   Supervision of other normal pregnancy, antepartum 08/22/2023   History of preterm delivery, currently pregnant 08/18/2023   Supervision of normal pregnancy 08/18/2023    PCP:   REFERRING PROVIDER: Guadlupe Spanish, CNM   REFERRING DIAG:  diastasis recti   Rationale for Evaluation and Treatment Rehabilitation  THERAPY DIAG:  Other abnormalities of gait and mobility  Other lack of coordination  Sacrococcygeal disorders, not elsewhere classified  ONSET DATE:   SUBJECTIVE:         SUBJECTIVE  STATEMENT TODAY:  Pt has stopped  working which has helped her a lot. Dizziness and lightheadedness got better. Swelling in the L leg is gone,. 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   Denied radiating down leg.  Numbness on L leg when lying on L side   2) Central low back pain  8-9/10  and L midback pain 10/10 with L side without radiating pain at either areas. Limits her from lifting and she has to sit a certain way and she has to lean over. 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 Yes: see above   PRECAUTIONS: pregnancy precautions   WEIGHT BEARING RESTRICTIONS: Yes   FALLS:  Has patient fallen in last 6 months? No   LIVING ENVIRONMENT: Lives with: partner  Lives in: trailers  Stairs: STE with rail   OCCUPATION: Therapist, nutritional   PLOF: IND   PATIENT GOALS:  Get through pregnancy without issues    OBJECTIVE:    [facility] PT Assessment - 01/21/24 1246       Observation/Other Assessments   Observations semi reclined position: excessive overuse of ab with deep core training      Palpation   SI assessment  levelled shoulder / pelvis             [facility] Adult PT Treatment/Exercise - 01/21/24 1248       Therapeutic Activites    Other Therapeutic Activities explained and instructed on use of breathing technique when in Labor      Neuro Re-ed    Neuro Re-ed Details  excessive cues for less ab overuse with deep core coordination                HOME EXERCISE PROGRAM: See pt instruction section    ASSESSMENT:  CLINICAL IMPRESSION:   Pt continues to show levelled pelvic and spinal alignment. Advanced to semi reclined position with deep core system training. Pt demo'd improved technique with less ab overuse. Explained and instructed on use of breathing technique when in labor and minimize worsening diastasis recti / minimize pelvic floor dysfunction  Plan to review deep core coordination and add kegel training  at next session  Pt  benefits from skilled PT  to help pt through her trimesters and  minimize pain / romote optimize IAP system for improved pelvic floor function, trunk stability, gait, balance, stabilization with mobility tasks.      OBJECTIVE IMPAIRMENTS decreased activity tolerance, decreased coordination, decreased endurance, decreased mobility, difficulty walking, decreased ROM, decreased strength, decreased safety awareness, hypomobility, increased muscle spasms, impaired flexibility, improper body mechanics, postural dysfunction, and pain. scar restrictions   ACTIVITY LIMITATIONS  self-care,  sleep, home chores, work tasks    PARTICIPATION LIMITATIONS:  community, work  activities    PERSONAL FACTORS       are also affecting patient's functional outcome.    REHAB POTENTIAL: Good   CLINICAL DECISION MAKING: Evolving/moderate complexity   EVALUATION COMPLEXITY: Moderate    PATIENT EDUCATION:    Education details: Showed pt anatomy images. Explained muscles attachments/ connection, physiology of deep core system/ spinal- thoracic-pelvis-lower kinetic chain as they relate to pt's presentation, Sx, and past Hx. Explained what and how these areas of deficits need to be restored to balance and function    See Therapeutic activity / neuromuscular re-education section  Answered pt's questions.   Person educated: Patient Education method: Explanation, Demonstration, Tactile cues, Verbal cues, and Handouts Education comprehension: verbalized understanding, returned demonstration, verbal cues required, tactile cues required, and needs further education     PLAN: PT FREQUENCY: 1x/week   PT DURATION: 10 weeks   PLANNED INTERVENTIONS: Therapeutic exercises, Therapeutic activity, Neuromuscular re-education, Balance training, Gait training, Patient/Family education, Self Care, Joint mobilization, Spinal mobilization, Moist heat, Taping, and Manual therapy, dry needling.   PLAN FOR NEXT SESSION: See  clinical impression for plan     GOALS: Goals reviewed with patient? Yes  SHORT TERM GOALS: Target date: .wplus4  1. Pt will demo IND with HEP                    Baseline: Not IND            Goal status: INITIAL   LONG TERM GOALS: Target date: 03/10/2024    1.Pt will demo proper deep core coordination without chest breathing and optimal excursion of diaphragm/pelvic floor in order to promote spinal stability and pelvic floor function  Baseline: dyscoordination Goal status: INITIAL  2.  Pt will demo > 5 pt change on FOTO  to improve QOL and function    Pelvic Pain baseline - 71 Lower score = better function     Lumber baseline  - 30 Higher score = better function  Goal status: INITIAL   3.  Pt will demo proper body mechanics in against gravity tasks and ADLs  work tasks, fitness  to minimize straining pelvic floor / back    Baseline: not IND, improper form that places strain on pelvic floor  Goal status: INITIAL    4. Pt will demo increased gait speed > 1.3 m/s with reciprocal gait pattern, longer stride length  in order to ambulate safely in community and return to fitness routine  Baseline: 1.09 m/s , mininal armswing, limited anterior rotation of L pelvis, L genu valgus  Goal status: INITIAL    5. 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 Pt will report no numbness on LLE during > 1 night and sleeping through the night  Baseline: Numbness on L leg when lying on L side , difficult to sleep Goal status: INITIAL     Mariane Masters, PT 01/21/2024, 10:14 AM

## 2024-01-21 NOTE — Patient Instructions (Addendum)
deep core level 1-2 ( 2 x day in semi reclined position due to pregnancy)   Can continue after pregnancy long term on your back  __  Use the breathing technique with delivery and not to push from upward abs

## 2024-01-22 ENCOUNTER — Ambulatory Visit: Payer: Medicaid Other | Admitting: Certified Nurse Midwife

## 2024-01-22 VITALS — BP 116/60 | HR 103 | Wt 142.0 lb

## 2024-01-22 DIAGNOSIS — Z3480 Encounter for supervision of other normal pregnancy, unspecified trimester: Secondary | ICD-10-CM

## 2024-01-22 DIAGNOSIS — D649 Anemia, unspecified: Secondary | ICD-10-CM

## 2024-01-22 DIAGNOSIS — Z3A30 30 weeks gestation of pregnancy: Secondary | ICD-10-CM

## 2024-01-22 DIAGNOSIS — O99013 Anemia complicating pregnancy, third trimester: Secondary | ICD-10-CM

## 2024-01-22 DIAGNOSIS — O09899 Supervision of other high risk pregnancies, unspecified trimester: Secondary | ICD-10-CM

## 2024-01-22 MED ORDER — PRENATAL VITAMIN 27-0.8 MG PO TABS: 1.0000 | ORAL_TABLET | Freq: Every day | ORAL | 3 refills | Status: AC

## 2024-01-22 NOTE — Progress Notes (Signed)
    Return Prenatal Note   Subjective   28 y.o. [id number] at [redacted]w[redacted]d presents for this follow-up prenatal visit.  Patient feeling well, active baby. Started iron but then misplaced prescription, if unable to find will inform so that new prescription can be sent. Has one more PT appointment then will plan postpartum follow up, symptoms improved since stopping work. Patient reports: Movement: Present Contractions: Irritability  Objective   Flow sheet Vitals: Pulse Rate: (!) 103 BP: 116/60 Fundal Height: 30 cm Fetal Heart Rate (bpm): 155 Presentation: Vertex Total weight gain: 32 lb (14.5 kg)  General Appearance  No acute distress, well appearing, and well nourished Pulmonary   Normal work of breathing Neurologic   Alert and oriented to person, place, and time Psychiatric   Mood and affect within normal limits  Assessment/Plan   Plan  28 y.o. [id number] at [redacted]w[redacted]d presents for follow-up OB visit. Reviewed prenatal record including previous visit note.  Supervision of other normal pregnancy, antepartum Reviewed kick counts and preterm labor warning signs. Instructed to call office or come to hospital with persistent headache, vision changes, regular contractions, leaking of fluid, decreased fetal movement or vaginal bleeding.  Anemia affecting pregnancy in third trimester Continue iron supplement, will send new prescription if needed. Continue stool softener.      No orders of the defined types were placed in this encounter.  Return in 2 weeks (on 02/05/2024) for ROB.   Future Appointments  Date Time Provider Department Center  01/28/2024  9:30 AM Mariane Masters, PT [facility] None  02/04/2024  9:30 AM Mariane Masters, PT [facility] None  02/05/2024  9:55 AM Linzie Collin, MD [facility] None  02/11/2024  8:45 AM Mariane Masters, PT [facility] None  02/18/2024  8:45 AM Mariane Masters, PT [facility] None  02/25/2024  8:45 AM Mariane Masters, PT [facility] None  03/03/2024  8:45  AM Mariane Masters, PT [facility] None    For next visit:  continue with routine prenatal care     Dominica Severin, CNM  01/21/2510:58 AM

## 2024-01-22 NOTE — Assessment & Plan Note (Addendum)
Reviewed kick counts and preterm labor warning signs. Instructed to call office or come to hospital with persistent headache, vision changes, regular contractions, leaking of fluid, decreased fetal movement or vaginal bleeding.

## 2024-01-22 NOTE — Assessment & Plan Note (Signed)
Continue iron supplement, will send new prescription if needed. Continue stool softener.

## 2024-01-28 ENCOUNTER — Ambulatory Visit: Payer: Medicaid Other | Attending: Obstetrics | Admitting: Physical Therapy

## 2024-01-28 DIAGNOSIS — M533 Sacrococcygeal disorders, not elsewhere classified: Secondary | ICD-10-CM | POA: Insufficient documentation

## 2024-01-28 DIAGNOSIS — R2689 Other abnormalities of gait and mobility: Secondary | ICD-10-CM | POA: Diagnosis present

## 2024-01-28 DIAGNOSIS — R278 Other lack of coordination: Secondary | ICD-10-CM | POA: Insufficient documentation

## 2024-01-28 NOTE — Therapy (Signed)
 OUTPATIENT PHYSICAL THERAPY  TREATMENT    Patient Name: Latoya Howell MRN: [id number] DOB:Aug 02, 1996, 28 y.o., female Today's Date: 01/28/2024   PT End of Session - 01/28/24 1006     Visit Number 4    Number of Visits 10    Date for PT Re-Evaluation 03/10/24    PT Start Time 0942    PT Stop Time 1015    PT Time Calculation (min) 33 min    Activity Tolerance Patient tolerated treatment well;No increased pain    Behavior During Therapy WFL for tasks assessed/performed             Past Medical History:  Diagnosis Date   Anemia    History of premature delivery    at 36 wks 2 d   Past Surgical History:  Procedure Laterality Date   denies surgical hx     Patient Active Problem List   Diagnosis Date Noted   Anemia affecting pregnancy in third trimester 01/07/2024   Supervision of other normal pregnancy, antepartum 08/22/2023   History of preterm delivery, currently pregnant 08/18/2023   Supervision of normal pregnancy 08/18/2023    PCP:   REFERRING PROVIDER: Justino Setter, CNM   REFERRING DIAG:  diastasis recti   Rationale for Evaluation and Treatment Rehabilitation  THERAPY DIAG:  Other abnormalities of gait and mobility  Sacrococcygeal disorders, not elsewhere classified  Other lack of coordination  ONSET DATE:   SUBJECTIVE:         SUBJECTIVE  STATEMENT TODAY:  Pt reports the deep core exercise has resolved her groin pain . LBP still persists but she knows it is part of third trimester . 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   Denied radiating down leg.  Numbness on L leg when lying on L side   2) Central low back pain  8-9/10  and L  midback pain 10/10 with L side without radiating pain at either areas. Limits her from lifting and she has to sit a certain way and she has to lean over. 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 Yes: see above   PRECAUTIONS: pregnancy precautions   WEIGHT BEARING RESTRICTIONS: Yes   FALLS:  Has patient fallen in last 6 months? No   LIVING ENVIRONMENT: Lives with: partner  Lives in: trailers  Stairs: STE with rail   OCCUPATION: Therapist, Nutritional   PLOF: IND   PATIENT GOALS:  Get through pregnancy without issues    OBJECTIVE:    [facility] PT Assessment - 01/28/24 1010       Observation/Other Assessments   Observations semi reclined position: excessive overuse of ab with deep core training      Squat   Comments knees anterior to ballmounds , poor form  Posterior WBing on heels with rise              [facility] Adult PT Treatment/Exercise - 01/28/24 1017       Therapeutic Activites    Other Therapeutic Activities practiced and instructed standing and , bending, picking items off floor with pelvic girdle stability      Neuro Re-ed    Neuro Re-ed Details  excessive cues for less ab overuse with deep core coordination, minimize hyperextended knees               HOME EXERCISE PROGRAM: See pt instruction section    ASSESSMENT:  CLINICAL IMPRESSION:   Reviewed deep core system training in semi reclined position and provided cues for refined details for proper coordination as pt demo'd dyscoordination. Pt demo'd improved technique with less ab overuse. Explained and instructed on use of breathing technique when in labor and minimize worsening diastasis recti / minimize pelvic floor dysfunction  Provided cues for standing and bending and picking up items off floor with alignment that promotes improved  pelvic girdle stability. Pt demo'd correctly with training. Cued for less hyperextended knees   Plan to add gentle resistance scapular/lumbar strengthening for repeated lifting of newborn to minimize LBP  at next session  Pt benefits from skilled PT  to help pt through her trimesters and  minimize pain / romote optimize IAP system for improved pelvic floor function, trunk stability, gait, balance, stabilization with mobility tasks.      OBJECTIVE IMPAIRMENTS decreased activity tolerance, decreased coordination, decreased endurance, decreased mobility, difficulty walking, decreased ROM, decreased strength, decreased safety awareness, hypomobility, increased muscle spasms, impaired flexibility, improper body mechanics, postural dysfunction, and pain. scar restrictions   ACTIVITY LIMITATIONS  self-care,  sleep, home chores, work tasks    PARTICIPATION LIMITATIONS:  community, work  activities    PERSONAL FACTORS       are also affecting patient's functional outcome.    REHAB POTENTIAL: Good   CLINICAL DECISION MAKING: Evolving/moderate complexity   EVALUATION COMPLEXITY: Moderate    PATIENT EDUCATION:    Education details: Showed pt anatomy images. Explained muscles attachments/ connection, physiology of deep core system/ spinal- thoracic-pelvis-lower kinetic chain as they relate to pt's presentation, Sx, and past Hx. Explained what and how these areas of deficits need to be restored to balance and function    See Therapeutic activity / neuromuscular re-education section  Answered pt's questions.   Person educated: Patient Education method: Explanation, Demonstration, Tactile cues, Verbal cues, and Handouts Education comprehension: verbalized understanding, returned demonstration, verbal cues required, tactile cues required, and needs further education     PLAN: PT FREQUENCY: 1x/week   PT DURATION: 10 weeks   PLANNED INTERVENTIONS: Therapeutic exercises, Therapeutic activity,  Neuromuscular re-education, Balance training, Gait training, Patient/Family education, Self Care, Joint mobilization, Spinal mobilization, Moist heat, Taping, and Manual therapy, dry needling.   PLAN FOR NEXT SESSION: See clinical impression for plan     GOALS: Goals reviewed with patient? Yes  SHORT TERM GOALS: Target date: .wplus4  1. Pt will demo IND with HEP                    Baseline: Not IND            Goal status: INITIAL   LONG TERM GOALS: Target date: 03/10/2024    1.Pt will demo proper deep core coordination without chest breathing and optimal excursion of diaphragm/pelvic floor in order to promote spinal stability and pelvic floor function  Baseline: dyscoordination Goal status: INITIAL  2.  Pt will demo > 5 pt change on FOTO  to improve QOL and function    Pelvic Pain baseline - 71 Lower score = better function     Lumber baseline  - 30 Higher score = better function  Goal status: INITIAL   3.  Pt will demo proper body mechanics in against gravity tasks and ADLs  work tasks, fitness  to minimize straining pelvic floor / back    Baseline: not IND, improper form that places strain on pelvic floor  Goal status: INITIAL    4. Pt will demo increased gait speed > 1.3 m/s with reciprocal gait pattern, longer stride length  in order to ambulate safely in community and return to fitness routine  Baseline: 1.09 m/s , mininal armswing, limited anterior rotation of L pelvis, L genu valgus  Goal status: INITIAL    5. 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 Pt will report no numbness on LLE during > 1 night and sleeping through the night  Baseline: Numbness on L leg when lying on L side , difficult to sleep Goal status: INITIAL     Pia Lupe Plump, PT 01/28/2024, 10:07 AM

## 2024-01-28 NOTE — Patient Instructions (Signed)
 Functional positions Semi tandem stand , 45 deg turn to changing table, car seat , standing at work at the counter To engage legs, pelvis    NO standing with hips to the side when holding baby   Soothing baby in arms: semi tandem stance, rocking back and forth walk side ways + mini squat  Facing strolle or playing on the floor: leg lifts standing or  do on all fours   __  Minisquat: Scoot buttocks back slight, hinge like you are looking at your reflection on a pond  Knees behind toes,  Inhale to smell flowers  Exhale on the rise like rocket  Do not lock knees, have more weight across ballmounds of feet, toes relaxed and spread them, not grip them   10 reps x 3 x day

## 2024-02-04 ENCOUNTER — Ambulatory Visit: Payer: Medicaid Other | Admitting: Physical Therapy

## 2024-02-05 ENCOUNTER — Ambulatory Visit (INDEPENDENT_AMBULATORY_CARE_PROVIDER_SITE_OTHER): Payer: Medicaid Other | Admitting: Obstetrics and Gynecology

## 2024-02-05 ENCOUNTER — Encounter: Payer: Self-pay | Admitting: Obstetrics and Gynecology

## 2024-02-05 VITALS — BP 124/75 | HR 98 | Wt 145.6 lb

## 2024-02-05 DIAGNOSIS — Z3A32 32 weeks gestation of pregnancy: Secondary | ICD-10-CM

## 2024-02-05 DIAGNOSIS — Z3483 Encounter for supervision of other normal pregnancy, third trimester: Secondary | ICD-10-CM

## 2024-02-05 DIAGNOSIS — Z3480 Encounter for supervision of other normal pregnancy, unspecified trimester: Secondary | ICD-10-CM

## 2024-02-05 NOTE — Progress Notes (Signed)
ROB. She states daily fetal movement. Reports she fell over the weekend on her back, denies any bleeding at this time.

## 2024-02-05 NOTE — Progress Notes (Signed)
ROB: She says she is "over it".  But when questioned she is generally doing well.  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  Reports daily fetal movement. She did fall down some steps a few days ago and has some back pain but it is gradually getting better.  Denies bleeding and reports baby to be moving normally.

## 2024-02-11 ENCOUNTER — Ambulatory Visit: Payer: Medicaid Other | Admitting: Physical Therapy

## 2024-02-18 ENCOUNTER — Ambulatory Visit: Payer: Medicaid Other | Admitting: Physical Therapy

## 2024-02-19 ENCOUNTER — Ambulatory Visit (INDEPENDENT_AMBULATORY_CARE_PROVIDER_SITE_OTHER): Payer: Medicaid Other | Admitting: Obstetrics and Gynecology

## 2024-02-19 ENCOUNTER — Encounter: Payer: Self-pay | Admitting: Obstetrics and Gynecology

## 2024-02-19 VITALS — BP 123/78 | HR 105 | Wt 145.3 lb

## 2024-02-19 DIAGNOSIS — Z3A34 34 weeks gestation of pregnancy: Secondary | ICD-10-CM | POA: Diagnosis not present

## 2024-02-19 DIAGNOSIS — Z3480 Encounter for supervision of other normal pregnancy, unspecified trimester: Secondary | ICD-10-CM | POA: Diagnosis not present

## 2024-02-19 NOTE — Progress Notes (Signed)
 ROB. Patient states daily fetal movement with increasing pelvic pressure. States she is ready for this baby to be out. She states no questions or concerns at this time.

## 2024-02-19 NOTE — Progress Notes (Signed)
 ROB: She is generally doing well.  She has begun having nausea again and is not happy about it.  She says she is "over this pregnancy".  She is certain that her baby is ready to come out in the day.  Reports daily fetal movement.  Denies contractions "I wish".  Cultures next visit.

## 2024-02-19 NOTE — Patient Instructions (Signed)
 Latoya Howell

## 2024-02-25 ENCOUNTER — Ambulatory Visit: Payer: Medicaid Other | Admitting: Physical Therapy

## 2024-03-03 ENCOUNTER — Ambulatory Visit: Payer: Medicaid Other | Admitting: Physical Therapy

## 2024-03-04 ENCOUNTER — Ambulatory Visit (INDEPENDENT_AMBULATORY_CARE_PROVIDER_SITE_OTHER): Payer: Medicaid Other | Admitting: Obstetrics and Gynecology

## 2024-03-04 ENCOUNTER — Other Ambulatory Visit (HOSPITAL_COMMUNITY)
Admission: RE | Admit: 2024-03-04 | Discharge: 2024-03-04 | Disposition: A | Discharge status: Home / Self Care | Source: Ambulatory Visit | Attending: Obstetrics and Gynecology | Admitting: Obstetrics and Gynecology

## 2024-03-04 ENCOUNTER — Encounter: Payer: Self-pay | Admitting: Obstetrics and Gynecology

## 2024-03-04 VITALS — BP 131/77 | HR 84 | Wt 148.3 lb

## 2024-03-04 DIAGNOSIS — Z3A36 36 weeks gestation of pregnancy: Secondary | ICD-10-CM | POA: Insufficient documentation

## 2024-03-04 DIAGNOSIS — Z3480 Encounter for supervision of other normal pregnancy, unspecified trimester: Secondary | ICD-10-CM

## 2024-03-04 DIAGNOSIS — Z113 Encounter for screening for infections with a predominantly sexual mode of transmission: Secondary | ICD-10-CM

## 2024-03-04 DIAGNOSIS — Z3685 Encounter for antenatal screening for Streptococcus B: Secondary | ICD-10-CM | POA: Diagnosis not present

## 2024-03-04 NOTE — Progress Notes (Signed)
 ROB:  EGA = 36-3.  She continues to express desire to not be pregnant.  She is doing well.  Has only Braxton Hicks contractions at this point.  Believes she may have lost "part of" her mucous plug.  GC/CT-GBS cultures done today.  Begin weekly visits.

## 2024-03-04 NOTE — Progress Notes (Signed)
 ROB. She states daily fetal movement along with braxton hicks and back pain. GC/CT and GBS cultures ordered. Patient states no questions or concerns at this time.

## 2024-03-05 LAB — CERVICOVAGINAL ANCILLARY ONLY
Chlamydia: NEGATIVE
Comment: NEGATIVE
Comment: NORMAL
Neisseria Gonorrhea: NEGATIVE

## 2024-03-06 LAB — STREP GP B NAA: Strep Gp B NAA: POSITIVE — AB

## 2024-03-11 ENCOUNTER — Encounter: Payer: Self-pay | Admitting: Obstetrics

## 2024-03-11 ENCOUNTER — Ambulatory Visit (INDEPENDENT_AMBULATORY_CARE_PROVIDER_SITE_OTHER): Admitting: Obstetrics

## 2024-03-11 VITALS — BP 137/66 | HR 99 | Wt 150.0 lb

## 2024-03-11 DIAGNOSIS — D696 Thrombocytopenia, unspecified: Secondary | ICD-10-CM

## 2024-03-11 DIAGNOSIS — Z3A37 37 weeks gestation of pregnancy: Secondary | ICD-10-CM | POA: Diagnosis not present

## 2024-03-11 DIAGNOSIS — O09899 Supervision of other high risk pregnancies, unspecified trimester: Secondary | ICD-10-CM

## 2024-03-11 DIAGNOSIS — Z3483 Encounter for supervision of other normal pregnancy, third trimester: Secondary | ICD-10-CM

## 2024-03-11 DIAGNOSIS — Z3480 Encounter for supervision of other normal pregnancy, unspecified trimester: Secondary | ICD-10-CM

## 2024-03-11 DIAGNOSIS — Z3490 Encounter for supervision of normal pregnancy, unspecified, unspecified trimester: Secondary | ICD-10-CM

## 2024-03-11 DIAGNOSIS — O99013 Anemia complicating pregnancy, third trimester: Secondary | ICD-10-CM

## 2024-03-11 NOTE — Assessment & Plan Note (Signed)
-   Reviewed labor s/s and when to present  - Handout given for labor prep herbs and encouraged belly binding.  - Danger signs reviewed and when to present - Plt 140 at 28 week labs, will repeat CBC today

## 2024-03-11 NOTE — Progress Notes (Signed)
    Return Prenatal Note   Assessment/Plan   Plan  28 y.o. [id number] at [redacted]w[redacted]d presents for follow-up OB visit. Reviewed prenatal record including previous visit note.  Supervision of normal pregnancy - Reviewed labor s/s and when to present  - Handout given for labor prep herbs and encouraged belly binding.  - Danger signs reviewed and when to present - Plt 140 at 28 week labs, will repeat CBC today    Orders Placed This Encounter  Procedures   CBC   Return in about 1 week (around 03/18/2024).   Future Appointments  Date Time Provider Department Center  03/17/2024 10:55 AM Dominica Severin, CNM [facility] None    For next visit:  Routine prenatal care    Subjective  Ready to meet baby!! Feeling occasional pelvic pressure and back pain. Requesting cervical exam today. Denies LOF, VB.   Movement: Present Contractions: Irritability  Objective   Flow sheet Vitals: Pulse Rate: 99 BP: 137/66 Fundal Height: 37 cm Fetal Heart Rate (bpm): 132 Dilation: Fingertip Effacement (%): Thick Station: -3 Total weight gain: 18.1 kg  General Appearance  No acute distress, well appearing, and well nourished Pulmonary   Normal work of breathing Neurologic   Alert and oriented to person, place, and time Psychiatric   Mood and affect within normal limits  Ulice Dash, CNM  03/11/24 10:26 AM

## 2024-03-12 LAB — CBC
Hematocrit: 32.3 % — ABNORMAL LOW (ref 34.0–46.6)
Hemoglobin: 10.7 g/dL — ABNORMAL LOW (ref 11.1–15.9)
MCH: 29.1 pg (ref 26.6–33.0)
MCHC: 33.1 g/dL (ref 31.5–35.7)
MCV: 88 fL (ref 79–97)
Platelets: 139 10*3/uL — ABNORMAL LOW (ref 150–450)
RBC: 3.68 x10E6/uL — ABNORMAL LOW (ref 3.77–5.28)
RDW: 13.9 % (ref 11.7–15.4)
WBC: 9.3 10*3/uL (ref 3.4–10.8)

## 2024-03-13 ENCOUNTER — Encounter: Payer: Self-pay | Admitting: Obstetrics

## 2024-03-13 ENCOUNTER — Observation Stay
Admission: EM | Admit: 2024-03-13 | Discharge: 2024-03-13 | Disposition: A | Discharge status: Home / Self Care | Attending: Obstetrics | Admitting: Obstetrics

## 2024-03-13 ENCOUNTER — Other Ambulatory Visit: Payer: Self-pay

## 2024-03-13 DIAGNOSIS — O26893 Other specified pregnancy related conditions, third trimester: Secondary | ICD-10-CM | POA: Diagnosis present

## 2024-03-13 DIAGNOSIS — Z3A37 37 weeks gestation of pregnancy: Secondary | ICD-10-CM | POA: Insufficient documentation

## 2024-03-13 DIAGNOSIS — O368130 Decreased fetal movements, third trimester, not applicable or unspecified: Secondary | ICD-10-CM | POA: Insufficient documentation

## 2024-03-13 DIAGNOSIS — R109 Unspecified abdominal pain: Secondary | ICD-10-CM | POA: Diagnosis present

## 2024-03-13 DIAGNOSIS — O4703 False labor before 37 completed weeks of gestation, third trimester: Secondary | ICD-10-CM | POA: Insufficient documentation

## 2024-03-13 DIAGNOSIS — Z3480 Encounter for supervision of other normal pregnancy, unspecified trimester: Principal | ICD-10-CM

## 2024-03-13 DIAGNOSIS — R1030 Lower abdominal pain, unspecified: Secondary | ICD-10-CM | POA: Insufficient documentation

## 2024-03-13 NOTE — OB Triage Note (Signed)
 Patient is a 28 yo, [id number], at 37 weeks 5 days. Patient presents to L&D for abdominal pain. Patient reports she woke up to use the bathroom and felt a pop in her lower abdomen. Patient states since then she has had sharp pain in her lower abdomen, especially when she moves.  Patient denies any vaginal bleeding or LOF. Patient reports FM has been decreased since the pop. Monitors applied and assessing. VSS. Initial fetal heart tone 135. Cowen CNM notified of patients arrival to unit. Plan to place in observation for fetal monitoring.

## 2024-03-13 NOTE — Final Progress Note (Signed)
 OB/Triage Note  Patient ID: Latoya Howell MRN: [id number] DOB/AGE: 1996/10/27 28 y.o.  Subjective  History of Present Illness: The patient is a 28 y.o. female [phone] at [redacted]w[redacted]d who presents for feeling a weird popping sensation in her abdomin this morning. Felt it once. Reports some braxton hicks contractions but nothing painful. Initially had decreased FM but felt FM after being here. Denies LOF, VB. States it wouldn't be a bad thing if she delivered today.   Past Medical History:  Diagnosis Date   Anemia    History of premature delivery    at 36 wks 2 d    Past Surgical History:  Procedure Laterality Date   denies surgical hx      No current facility-administered medications on file prior to encounter.   Current Outpatient Medications on File Prior to Encounter  Medication Sig Dispense Refill   ferrous sulfate 325 (65 FE) MG EC tablet Take 1 tablet (325 mg total) by mouth daily. 90 tablet 0   Prenatal Vit-Fe Fumarate-FA (PRENATAL VITAMIN) 27-0.8 MG TABS Take 1 tablet by mouth daily. 90 tablet 3   docusate sodium (COLACE) 100 MG capsule Take 1 capsule (100 mg total) by mouth daily. Take with your iron pill to help prevent constipation. 90 capsule 0   omeprazole (PRILOSEC OTC) 20 MG tablet Take 1 tablet (20 mg total) by mouth in the morning and at bedtime for 14 days. 28 tablet 0   ondansetron (ZOFRAN-ODT) 4 MG disintegrating tablet Take 1 tablet (4 mg total) by mouth every 6 (six) hours as needed for nausea. 20 tablet 2    No Known Allergies  Social History   Socioeconomic History   Marital status: Single    Spouse name: Not on file   Number of children: 2   Years of education: 12   Highest education level: Not on file  Occupational History   Occupation: [employer]  Tobacco Use   Smoking status: Never   Smokeless tobacco: Never  Vaping Use   Vaping status: Never Used  Substance and Sexual Activity   Alcohol use: Not Currently    Alcohol/week: 1.0 standard drink of  alcohol    Types: 1 Glasses of wine per week    Comment: Birthday in January 2023   Drug use: Not Currently    Types: Marijuana    Comment: Last use Thanksgiving 2020.   Sexual activity: Yes    Partners: Male    Birth control/protection: None    Comment: last intercourse January 2023  Other Topics Concern   Not on file  Social History Narrative   Not on file   Social Drivers of Health   Financial Resource Strain: Medium Risk (08/22/2023)   Overall Financial Resource Strain (CARDIA)    Difficulty of Paying Living Expenses: Somewhat hard  Food Insecurity: No Food Insecurity (08/22/2023)   Hunger Vital Sign    Worried About Running Out of Food in the Last Year: Never true    Ran Out of Food in the Last Year: Never true  Transportation Needs: No Transportation Needs (08/22/2023)   PRAPARE - Administrator, Civil Service (Medical): No    Lack of Transportation (Non-Medical): No  Physical Activity: Sufficiently Active (08/22/2023)   Exercise Vital Sign    Days of Exercise per Week: 7 days    Minutes of Exercise per Session: 90 min  Stress: No Stress Concern Present (08/22/2023)   [employer] of Occupational Health - Occupational Stress Questionnaire  Feeling of Stress : Not at all  Social Connections: Unknown (08/22/2023)   Social Connection and Isolation Panel [NHANES]    Frequency of Communication with Friends and Family: More than three times a week    Frequency of Social Gatherings with Friends and Family: Three times a week    Attends Religious Services: Never    Active Member of Clubs or Organizations: No    Attends Banker Meetings: Never    Marital Status: Not on file  Intimate Partner Violence: Not At Risk (08/22/2023)   Humiliation, Afraid, Rape, and Kick questionnaire    Fear of Current or Ex-Partner: No    Emotionally Abused: No    Physically Abused: No    Sexually Abused: No    Family History  Problem Relation Age of Onset    Diabetes Mother    Healthy Sister    Healthy Brother    Diabetes Maternal Grandmother    Diabetes Maternal Grandfather    Diabetes Maternal Aunt      ROS    Objective  Physical Exam: BP 139/71 (BP Location: Left Arm)   Pulse 84   Temp 98.1 F (36.7 C) (Oral)   Resp 18   Ht 5\' 2"  (1.575 m)   Wt 68 kg   LMP 07/02/2023 (Exact Date)   BMI 27.44 kg/m   OBGyn Exam  FHT 135, mod variability, pos accels, Initially had a couple variable decels which resolved with position change and long reactive strip Toco contractions detected but not felt by patient   Hospital Course: The patient was admitted to [facility] Triage for observation. Had RNST, pain resolved, no s/sx of labor. Discharged home with labor precautions.  Assessment: 28 y.o. female [phone] at [redacted]w[redacted]d  Not in labor Pain resolved  Plan: Discharge home Follow up at next [facility] Teaching: s/sx of labor, when to return   Allergies as of 03/13/2024   No Known Allergies      Medication List     TAKE these medications    docusate sodium 100 MG capsule Commonly known as: Colace Take 1 capsule (100 mg total) by mouth daily. Take with your iron pill to help prevent constipation.   ferrous sulfate 325 (65 FE) MG EC tablet Take 1 tablet (325 mg total) by mouth daily.   omeprazole 20 MG tablet Commonly known as: PriLOSEC OTC Take 1 tablet (20 mg total) by mouth in the morning and at bedtime for 14 days.   ondansetron 4 MG disintegrating tablet Commonly known as: ZOFRAN-ODT Take 1 tablet (4 mg total) by mouth every 6 (six) hours as needed for nausea.   Prenatal Vitamin 27-0.8 MG Tabs Take 1 tablet by mouth daily.         Total time spent taking care of this patient: 60 minutes  Signed: Raeford Razor CNM, FNP 03/13/2024, 11:16 AM

## 2024-03-13 NOTE — Progress Notes (Signed)
 Discharge instructions provided to patient. Patient verbalized understanding. Pt educated on signs and symptoms of labor, vaginal bleeding, LOF, and fetal movement. Red flag signs reviewed by RN. Patient discharged home in stable condition.

## 2024-03-15 ENCOUNTER — Encounter: Payer: Self-pay | Admitting: Obstetrics

## 2024-03-17 ENCOUNTER — Ambulatory Visit (INDEPENDENT_AMBULATORY_CARE_PROVIDER_SITE_OTHER): Admitting: Certified Nurse Midwife

## 2024-03-17 VITALS — BP 133/80 | HR 102 | Wt 149.1 lb

## 2024-03-17 DIAGNOSIS — Z3480 Encounter for supervision of other normal pregnancy, unspecified trimester: Secondary | ICD-10-CM

## 2024-03-17 NOTE — Assessment & Plan Note (Signed)
 Reviewed kick counts and preterm labor warning signs. Instructed to call office or come to hospital with persistent headache, vision changes, regular contractions, leaking of fluid, decreased fetal movement or vaginal bleeding. Miles circuit reviewed & handout provided, reviewed that each labor is different, continue cervical ripening methods at home.

## 2024-03-17 NOTE — Patient Instructions (Signed)
 Third Trimester of Pregnancy  The third trimester of pregnancy is from week 28 through week 40. This is months 7 through 9. The third trimester is a time when your baby is growing fast. Body changes during your third trimester Your body continues to change during this time. The changes usually go away after your baby is born. Physical changes You will continue to gain weight. You may get stretch marks on your hips, belly, and breasts. Your breasts will keep growing and may hurt. A yellow fluid (colostrum) may leak from your breasts. This is the first milk you're making for your baby. Your hair may grow faster and get thicker. In some cases, you may get hair loss. Your belly button may stick out. You may have more swelling in your hands, face, or ankles. Health changes You may have heartburn. You may feel short of breath. This is caused by the uterus that is now bigger. You may have more aches in the pelvis, back, or thighs. You may have more tingling or numbness in your hands, arms, and legs. You may pee more often. You may have trouble pooping (constipation) or swollen veins in the butt that can itch or get painful (hemorrhoids). Other changes You may have more problems sleeping. You may notice the baby moving lower in your belly (dropping). You may have more fluid coming from your vagina. Your joints may feel loose, and you may have pain around your pelvic bone. Follow these instructions at home: Medicines Take medicines only as told by your health care provider. Some medicines are not safe during pregnancy. Your provider may change the medicines that you take. Do not take any medicines unless told to by your provider. Take a prenatal vitamin that has at least 600 micrograms (mcg) of folic acid. Do not use herbal medicines, illegal drugs, or medicines that are not approved by your provider. Eating and drinking While you're pregnant your body needs additional nutrition to help  support your growing baby. Talk with your provider about your nutritional needs. Activity Most women are able to exercise regularly during pregnancy. Exercise routines may need to change at the end of your pregnancy. Talk to your provider about your activities and exercise routine. Relieving pain and discomfort Rest often with your legs raised if you have leg cramps or low back pain. Take warm sitz baths to soothe pain from hemorrhoids. Use hemorrhoid cream if your provider says it's okay. Wear a good, supportive bra if your breasts hurt. Do not use hot tubs, steam rooms, or saunas. Do not douche. Do not use tampons or scented pads. Safety Talk to your provider before traveling far distances. Wear your seatbelt at all times when you're in a car. Talk to your provider if someone hits you, hurts you, or yells at you. Preparing for birth To prepare for your baby: Take childbirth and breastfeeding classes. Visit the hospital and tour the maternity area. Buy a rear-facing car seat. Learn how to install it in your car. General instructions Avoid cat litter boxes and soil used by cats. These things carry germs that can cause harm to your pregnancy and your baby. Do not drink alcohol, smoke, vape, or use products with nicotine or tobacco in them. If you need help quitting, talk with your provider. Keep all follow-up visits for your third trimester. Your provider will do more exams and tests during this trimester. Write down your questions. Take them to your prenatal visits. Your provider also will: Talk with you about  your overall health. Give you advice or refer you to specialists who can help with different needs, including: Mental health and counseling. Foods and healthy eating. Ask for help if you need help with food. Where to find more information American Pregnancy Association: americanpregnancy.org [employer] of Obstetricians and Gynecologists: acog.org Office on [employer] Health:  TravelLesson.ca Contact a health care provider if: You have a headache that does not go away when you take medicine. You have any of these problems: You can't eat or drink. You have nausea and vomiting. You have watery poop (diarrhea) for 2 days or more. You have pain when you pee, or your pee smells bad. You have been sick for 2 days or more and aren't getting better. Contact your provider right away if: You have any of these coming from your vagina: Abnormal discharge. Bad-smelling fluid. Bleeding. Your baby is moving less than usual. You have signs of labor: You have any contractions, belly cramping, or have pain in your pelvis or lower back before 37 weeks of pregnancy (preterm labor). You have regular contractions that are less than 5 minutes apart. Your water breaks. You have symptoms of high blood pressure or preeclampsia. These include: A severe, throbbing headache that does not go away. Sudden or extreme swelling of your face, hands, legs, or feet. Vision problems: You see spots. You have blurry vision. Your eyes are sensitive to light. If you can't reach your provider, go to an urgent care or emergency room. Get help right away if: You faint, become confused, or can't think clearly. You have chest pain or trouble breathing. You have any kind of injury, such as from a fall or a car crash. These symptoms may be an emergency. Call 911 right away. Do not wait to see if the symptoms will go away. Do not drive yourself to the hospital. This information is not intended to replace advice given to you by your health care provider. Make sure you discuss any questions you have with your health care provider. Document Revised: 09/11/2023 Document Reviewed: 04/11/2023 Elsevier Patient Education  2024 Elsevier Inc. Group B Streptococcus Infection During Pregnancy Group B Streptococcus (GBS) is a type of bacteria that is often found in healthy people. It is commonly found in the  rectum, vagina, and intestines. In people who are healthy and not pregnant, the bacteria rarely cause serious illness or complications. However, women who test positive for GBS during pregnancy can pass the bacteria to the baby during childbirth. This can cause serious infection in the baby after birth. Women with GBS may also have infections during their pregnancy or soon after childbirth. The infections include urinary tract infections (UTIs) or infections of the uterus. GBS also increases a woman's risk of complications during pregnancy, such as early labor or delivery, miscarriage, or stillbirth. Routine testing for GBS is recommended for all pregnant women. What are the causes? This condition is caused by bacteria called Streptococcus agalactiae. What increases the risk? You may have a higher risk for GBS infection during pregnancy if you had one during a past pregnancy. What are the signs or symptoms? In most cases, GBS infection does not cause symptoms in pregnant women. If symptoms exist, they may include: Labor that starts before the 37th week of pregnancy. A UTI or bladder infection. This may cause a fever, frequent urination, or pain and burning during urination. Fever during labor. There can also be a rapid heartbeat in the mother or baby. Rare but serious symptoms of a GBS  infection in women include: Blood infection (septicemia). This may cause fever, chills, or confusion. Lung infection (pneumonia). This may cause fever, chills, cough, rapid breathing, chest pain, or difficulty breathing. Bone, joint, skin, or soft tissue infection. How is this diagnosed? You may be screened for GBS between week 35 and week 37 of pregnancy. If you have symptoms of preterm labor, you may be screened earlier. This condition is diagnosed based on lab test results from: A swab of fluid from the vagina and rectum. A urine sample. How is this treated? This condition is treated with antibiotic medicine.  Antibiotic medicine may be given: To you when you go into labor, or as soon as your water breaks. The medicines will continue until after you give birth. If you are having a cesarean delivery, you do not need antibiotics unless your water has broken. To your baby, if he or she requires treatment. Your health care provider will check your baby to decide if he or she needs antibiotics to prevent a serious infection. Follow these instructions at home: Take over-the-counter and prescription medicines only as told by your health care provider. Take your antibiotic medicine as told by your health care provider. Do not stop taking the antibiotic even if you start to feel better. Keep all pre-birth (prenatal) visits and follow-up visits as told by your health care provider. This is important. Contact a health care provider if: You have pain or burning when you urinate. You have to urinate more often than usual. You have a fever or chills. You develop a bad-smelling vaginal discharge. Get help right away if: Your water breaks. You go into labor. You have severe pain in your abdomen. You have difficulty breathing. You have chest pain. These symptoms may represent a serious problem that is an emergency. Do not wait to see if the symptoms will go away. Get medical help right away. Call your local emergency services (911 in the [place] Do not drive yourself to the hospital. Summary GBS is a type of bacteria that is common in healthy people. During pregnancy, colonization with GBS can cause serious complications for you or your baby. Your health care provider will screen you between 35 and 37 weeks of pregnancy to determine if you are colonized with GBS. If you are colonized with GBS during pregnancy, your health care provider will recommend antibiotics through an IV during labor. After delivery, your baby will be evaluated for complications related to potential GBS infection and may require antibiotics to  prevent a serious infection. This information is not intended to replace advice given to you by your health care provider. Make sure you discuss any questions you have with your health care provider. Document Revised: 11/25/2022 Document Reviewed: 11/25/2022 Elsevier Patient Education  2024 [employer].

## 2024-03-17 NOTE — Progress Notes (Signed)
    Return Prenatal Note   Subjective   28 y.o. [id number] at [redacted]w[redacted]d presents for this follow-up prenatal visit.  Patient ready to meet baby! Other labors happened earlier so feeling a bit frustrated with irregular contractions. Baby ROP on [place] today, reviewed Miles circuit to help optimize position & encourage labor. Patient reports: Movement: Present Contractions: Irregular  Objective   Flow sheet Vitals: Pulse Rate: (!) 102 BP: 133/80 Fundal Height: 38 cm Fetal Heart Rate (bpm): 135 Presentation: Vertex (ROP) Dilation: Fingertip Effacement (%): 30 Station: -2 Total weight gain: 39 lb 1.6 oz (17.7 kg)  General Appearance  No acute distress, well appearing, and well nourished Pulmonary   Normal work of breathing Neurologic   Alert and oriented to person, place, and time Psychiatric   Mood and affect within normal limits  Assessment/Plan   Plan  28 y.o. [id number] at [redacted]w[redacted]d presents for follow-up OB visit. Reviewed prenatal record including previous visit note.  Supervision of other normal pregnancy, antepartum Reviewed kick counts and preterm labor warning signs. Instructed to call office or come to hospital with persistent headache, vision changes, regular contractions, leaking of fluid, decreased fetal movement or vaginal bleeding. Miles circuit reviewed & handout provided, reviewed that each labor is different, continue cervical ripening methods at home.      No orders of the defined types were placed in this encounter.  Return in 1 week (on 03/24/2024) for ROB.   Future Appointments  Date Time Provider Department Center  03/26/2024 10:35 AM Dominica Severin, CNM [facility] None  04/01/2024  9:35 AM Dominica Severin, CNM [facility] None    For next visit:  continue with routine prenatal care     Dominica Severin, CNM  03/18/2511:52 PM

## 2024-03-19 ENCOUNTER — Encounter: Payer: Self-pay | Admitting: Certified Nurse Midwife

## 2024-03-26 ENCOUNTER — Ambulatory Visit: Admitting: Certified Nurse Midwife

## 2024-03-26 ENCOUNTER — Other Ambulatory Visit: Payer: Self-pay

## 2024-03-26 ENCOUNTER — Other Ambulatory Visit: Payer: Self-pay | Admitting: Certified Nurse Midwife

## 2024-03-26 ENCOUNTER — Observation Stay
Admission: EM | Admit: 2024-03-26 | Discharge: 2024-03-26 | Disposition: A | Discharge status: Home / Self Care | Attending: Obstetrics and Gynecology | Admitting: Obstetrics and Gynecology

## 2024-03-26 ENCOUNTER — Encounter: Payer: Self-pay | Admitting: Certified Nurse Midwife

## 2024-03-26 VITALS — BP 151/88 | HR 116 | Wt 151.8 lb

## 2024-03-26 DIAGNOSIS — O163 Unspecified maternal hypertension, third trimester: Secondary | ICD-10-CM

## 2024-03-26 DIAGNOSIS — Z3A39 39 weeks gestation of pregnancy: Secondary | ICD-10-CM | POA: Diagnosis not present

## 2024-03-26 DIAGNOSIS — Z3480 Encounter for supervision of other normal pregnancy, unspecified trimester: Secondary | ICD-10-CM

## 2024-03-26 DIAGNOSIS — O1493 Unspecified pre-eclampsia, third trimester: Secondary | ICD-10-CM | POA: Diagnosis present

## 2024-03-26 DIAGNOSIS — Z3483 Encounter for supervision of other normal pregnancy, third trimester: Secondary | ICD-10-CM

## 2024-03-26 LAB — COMPREHENSIVE METABOLIC PANEL WITH GFR
ALT: 13 U/L (ref 0–44)
AST: 24 U/L (ref 15–41)
Albumin: 3.1 g/dL — ABNORMAL LOW (ref 3.5–5.0)
Alkaline Phosphatase: 205 U/L — ABNORMAL HIGH (ref 38–126)
Anion gap: 9 (ref 5–15)
BUN: <5 mg/dL — ABNORMAL LOW (ref 6–20)
CO2: 19 mmol/L — ABNORMAL LOW (ref 22–32)
Calcium: 9 mg/dL (ref 8.9–10.3)
Chloride: 109 mmol/L (ref 98–111)
Creatinine, Ser: 0.73 mg/dL (ref 0.44–1.00)
GFR, Estimated: >60 mL/min (ref >60)
Glucose, Bld: 70 mg/dL (ref 70–99)
Potassium: 4.1 mmol/L (ref 3.5–5.1)
Sodium: 137 mmol/L (ref 135–145)
Total Bilirubin: 0.3 mg/dL (ref 0.0–1.2)
Total Protein: 6.9 g/dL (ref 6.5–8.1)

## 2024-03-26 LAB — CBC
HCT: 32.7 % — ABNORMAL LOW (ref 36.0–46.0)
Hemoglobin: 10.7 g/dL — ABNORMAL LOW (ref 12.0–15.0)
MCH: 28.9 pg (ref 26.0–34.0)
MCHC: 32.7 g/dL (ref 30.0–36.0)
MCV: 88.4 fL (ref 80.0–100.0)
Platelets: 156 10*3/uL (ref 150–400)
RBC: 3.7 MIL/uL — ABNORMAL LOW (ref 3.87–5.11)
RDW: 14.7 % (ref 11.5–15.5)
WBC: 7.1 10*3/uL (ref 4.0–10.5)
nRBC: 0 % (ref 0.0–0.2)

## 2024-03-26 LAB — PROTEIN / CREATININE RATIO, URINE
Creatinine, Urine: 14 mg/dL
Total Protein, Urine: <6 mg/dL

## 2024-03-26 MED ORDER — ACETAMINOPHEN 325 MG PO TABS: 650.0000 mg | ORAL_TABLET | ORAL | Status: DC | PRN

## 2024-03-26 MED ORDER — SOD CITRATE-CITRIC ACID 500-334 MG/5ML PO SOLN: 30.0000 mL | ORAL | Status: DC | PRN

## 2024-03-26 MED ORDER — LACTATED RINGERS IV SOLN: 500.0000 mL | INTRAVENOUS | Status: DC | PRN

## 2024-03-26 MED ORDER — ONDANSETRON HCL 4 MG/2ML IJ SOLN: 4.0000 mg | Freq: Four times a day (QID) | INTRAMUSCULAR | Status: DC | PRN

## 2024-03-26 NOTE — Patient Instructions (Signed)
 Third Trimester of Pregnancy  The third trimester of pregnancy is from week 28 through week 40. This is months 7 through 9. The third trimester is a time when your baby is growing fast. Body changes during your third trimester Your body continues to change during this time. The changes usually go away after your baby is born. Physical changes You will continue to gain weight. You may get stretch marks on your hips, belly, and breasts. Your breasts will keep growing and may hurt. A yellow fluid (colostrum) may leak from your breasts. This is the first milk you're making for your baby. Your hair may grow faster and get thicker. In some cases, you may get hair loss. Your belly button may stick out. You may have more swelling in your hands, face, or ankles. Health changes You may have heartburn. You may feel short of breath. This is caused by the uterus that is now bigger. You may have more aches in the pelvis, back, or thighs. You may have more tingling or numbness in your hands, arms, and legs. You may pee more often. You may have trouble pooping (constipation) or swollen veins in the butt that can itch or get painful (hemorrhoids). Other changes You may have more problems sleeping. You may notice the baby moving lower in your belly (dropping). You may have more fluid coming from your vagina. Your joints may feel loose, and you may have pain around your pelvic bone. Follow these instructions at home: Medicines Take medicines only as told by your health care provider. Some medicines are not safe during pregnancy. Your provider may change the medicines that you take. Do not take any medicines unless told to by your provider. Take a prenatal vitamin that has at least 600 micrograms (mcg) of folic acid. Do not use herbal medicines, illegal drugs, or medicines that are not approved by your provider. Eating and drinking While you're pregnant your body needs additional nutrition to help  support your growing baby. Talk with your provider about your nutritional needs. Activity Most women are able to exercise regularly during pregnancy. Exercise routines may need to change at the end of your pregnancy. Talk to your provider about your activities and exercise routine. Relieving pain and discomfort Rest often with your legs raised if you have leg cramps or low back pain. Take warm sitz baths to soothe pain from hemorrhoids. Use hemorrhoid cream if your provider says it's okay. Wear a good, supportive bra if your breasts hurt. Do not use hot tubs, steam rooms, or saunas. Do not douche. Do not use tampons or scented pads. Safety Talk to your provider before traveling far distances. Wear your seatbelt at all times when you're in a car. Talk to your provider if someone hits you, hurts you, or yells at you. Preparing for birth To prepare for your baby: Take childbirth and breastfeeding classes. Visit the hospital and tour the maternity area. Buy a rear-facing car seat. Learn how to install it in your car. General instructions Avoid cat litter boxes and soil used by cats. These things carry germs that can cause harm to your pregnancy and your baby. Do not drink alcohol, smoke, vape, or use products with nicotine or tobacco in them. If you need help quitting, talk with your provider. Keep all follow-up visits for your third trimester. Your provider will do more exams and tests during this trimester. Write down your questions. Take them to your prenatal visits. Your provider also will: Talk with you about  your overall health. Give you advice or refer you to specialists who can help with different needs, including: Mental health and counseling. Foods and healthy eating. Ask for help if you need help with food. Where to find more information American Pregnancy Association: americanpregnancy.org [employer] of Obstetricians and Gynecologists: acog.org Office on [employer] Health:  [employer]Less[employer] Contact a health care provider if: You have a headache that does not go away when you take medicine. You have any of these problems: You can't eat or drink. You have nausea and vomiting. You have watery poop (diarrhea) for 2 days or more. You have pain when you pee, or your pee smells bad. You have been sick for 2 days or more and aren't getting better. Contact your provider right away if: You have any of these coming from your vagina: Abnormal discharge. Bad-smelling fluid. Bleeding. Your baby is moving less than usual. You have signs of labor: You have any contractions, belly cramping, or have pain in your pelvis or lower back before 37 weeks of pregnancy (preterm labor). You have regular contractions that are less than 5 minutes apart. Your water breaks. You have symptoms of high blood pressure or preeclampsia. These include: A severe, throbbing headache that does not go away. Sudden or extreme swelling of your face, hands, legs, or feet. Vision problems: You see spots. You have blurry vision. Your eyes are sensitive to light. If you can't reach your provider, go to an urgent care or emergency room. Get help right away if: You faint, become confused, or can't think clearly. You have chest pain or trouble breathing. You have any kind of injury, such as from a fall or a car crash. These symptoms may be an emergency. Call 911 right away. Do not wait to see if the symptoms will go away. Do not drive yourself to the hospital. This information is not intended to replace advice given to you by your health care provider. Make sure you discuss any questions you have with your health care provider. Document Revised: 09/11/2023 Document Reviewed: 04/11/2023 Elsevier Patient Education  2024 [employer].

## 2024-03-26 NOTE — OB Triage Note (Signed)
 Discharge home. Left floor ambulatory with family. Elaina Hoops

## 2024-03-26 NOTE — OB Triage Provider Note (Signed)
    L&D OB Triage Note  SUBJECTIVE Latoya Howell is a 28 y.o. [id number] female at [redacted]w[redacted]d, EDD Estimated Date of Delivery: 03/29/24 who presented to triage from the office for Pre eclampsia evaluations. She was seen today for routine return ob appointment and had elevated BP.  She denies headache and visual changes. She notes right sided side /back pain . She is feeling good fetal movement. Denies contractions, vaginal bleeding and leaking of fluid.   OB History  Gravida Para Term Preterm AB Living  4 2 1 1 1 2   SAB IAB Ectopic Multiple Live Births  0 0 0 0 2    # Outcome Date GA Lbr Len/2nd Weight Sex Type Anes PTL Lv  4 Current           3 AB 09/22/20     TAB     2 Term 07/15/20 [redacted]w[redacted]d  3175 g F Vag-Spont   LIV  1 Preterm 01/10/14 [redacted]w[redacted]d  2268 g M Vag-Spont   LIV    Medications Prior to Admission  Medication Sig Dispense Refill Last Dose/Taking   ferrous sulfate 325 (65 FE) MG EC tablet Take 1 tablet (325 mg total) by mouth daily. 90 tablet 0 03/25/2024   Prenatal Vit-Fe Fumarate-FA (PRENATAL VITAMIN) 27-0.8 MG TABS Take 1 tablet by mouth daily. 90 tablet 3 03/25/2024   docusate sodium (COLACE) 100 MG capsule Take 1 capsule (100 mg total) by mouth daily. Take with your iron pill to help prevent constipation. (Patient not taking: Reported on 03/26/2024) 90 capsule 0 Not Taking   omeprazole (PRILOSEC OTC) 20 MG tablet Take 1 tablet (20 mg total) by mouth in the morning and at bedtime for 14 days. 28 tablet 0    ondansetron (ZOFRAN-ODT) 4 MG disintegrating tablet Take 1 tablet (4 mg total) by mouth every 6 (six) hours as needed for nausea. (Patient not taking: Reported on 03/26/2024) 20 tablet 2 Not Taking     OBJECTIVE  Nursing Evaluation:   BP 126/66   Pulse 87   Ht 5\' 2"  (1.575 m)   Wt 68.9 kg   LMP 07/02/2023 (Exact Date)   BMI 27.76 kg/m    Findings:   Lab negative for pre eclampsia.       NST was performed and has been reviewed by me.  NST INTERPRETATION: Category I  Mode:  External Baseline Rate (A): 120 bpm Variability: Minimal, Moderate Accelerations: 15 x 15 Decelerations: None     Contraction Frequency (min): occ ctx with irritability  ASSESSMENT Impression:  1.  Pregnancy:  [id number] at [redacted]w[redacted]d , EDD Estimated Date of Delivery: 03/29/24 2.  Reassuring fetal and maternal status 3.  Musculoskeletal pain in pregnancy  4. Negative for pre eclampsia 5. BP with in normal range on L&D  PLAN 1. Current condition and above findings reviewed.  Reassuring fetal and maternal condition. Reviewed Pre e signs and symptoms.  2. Discharge home with standard labor precautions given to return to L&D or call the office for problems.  3. Continue routine prenatal care. Follow up as scheduled in office next week.   I was present and evaluated this patient in person.   Doreene Burke, CNM

## 2024-03-26 NOTE — Assessment & Plan Note (Signed)
 To L&D for labs & serial BP

## 2024-03-26 NOTE — Assessment & Plan Note (Signed)
 Reviewed kick counts and preterm labor warning signs. Instructed to call office or come to hospital with persistent headache, vision changes, regular contractions, leaking of fluid, decreased fetal movement or vaginal bleeding.  To L&D for evaluation

## 2024-03-26 NOTE — OB Triage Note (Signed)
 Sent over from the office for pre-e workup. Denies visual disturbances and epigastric pain. Latoya Howell

## 2024-03-26 NOTE — Progress Notes (Signed)
    Return Prenatal Note   Subjective   28 y.o. [id number] at [redacted]w[redacted]d presents for this follow-up prenatal visit.  Patient denies headache or visual changes, reports episodes of dizziness & right sided pain today. Patient reports: Movement: Present Contractions: Irritability  Objective   Flow sheet Vitals: Pulse Rate: (!) 116 BP: (!) 151/88 Fundal Height: 38 cm Fetal Heart Rate (bpm): 140 Presentation: Vertex Dilation: Fingertip Effacement (%): Thick Station: -3 Total weight gain: 41 lb 12.8 oz (19 kg)  General Appearance  No acute distress, well appearing, and well nourished Pulmonary   Normal work of breathing Neurologic   Alert and oriented to person, place, and time Psychiatric   Mood and affect within normal limits  Assessment/Plan   Plan  28 y.o. [id number] at [redacted]w[redacted]d presents for follow-up OB visit. Reviewed prenatal record including previous visit note.  No problem-specific Assessment & Plan notes found for this encounter.      No orders of the defined types were placed in this encounter.  No follow-ups on file.   Future Appointments  Date Time Provider Department Center  04/01/2024  9:35 AM Dominica Severin, CNM [facility] None    For next visit:   to L&D for pre-eclampsia evaluation     Dominica Severin, CNM  03/26/2509:58 AM

## 2024-04-01 ENCOUNTER — Encounter: Admitting: Certified Nurse Midwife
